# Patient Record
Sex: Female | Born: 1960 | Race: Black or African American | Hispanic: No | Marital: Married | Smoking: Never smoker
Health system: Southern US, Community
[De-identification: ages and names within clinical notes are randomized; demographics above are authoritative.]

## PROBLEM LIST (undated history)

## (undated) HISTORY — PX: BACK SURGERY: SHX140

## (undated) HISTORY — PX: GASTRIC BYPASS: SHX52

## (undated) HISTORY — PX: SPINAL FUSION: SHX223

## (undated) HISTORY — PX: ROTATOR CUFF REPAIR: SHX139

---

## 2015-07-12 DIAGNOSIS — R42 Dizziness and giddiness: Secondary | ICD-10-CM | POA: Insufficient documentation

## 2015-07-12 DIAGNOSIS — M5416 Radiculopathy, lumbar region: Secondary | ICD-10-CM | POA: Insufficient documentation

## 2015-07-12 DIAGNOSIS — M5412 Radiculopathy, cervical region: Secondary | ICD-10-CM | POA: Insufficient documentation

## 2015-12-05 DIAGNOSIS — Z9884 Bariatric surgery status: Secondary | ICD-10-CM | POA: Insufficient documentation

## 2016-03-21 ENCOUNTER — Emergency Department (HOSPITAL_COMMUNITY)
Admission: EM | Admit: 2016-03-21 | Discharge: 2016-03-21 | Disposition: A | Discharge status: Home / Self Care | Payer: Medicare Other | Attending: Emergency Medicine | Admitting: Emergency Medicine

## 2016-03-21 ENCOUNTER — Encounter (HOSPITAL_COMMUNITY): Payer: Self-pay | Admitting: Emergency Medicine

## 2016-03-21 DIAGNOSIS — Y999 Unspecified external cause status: Secondary | ICD-10-CM | POA: Diagnosis not present

## 2016-03-21 DIAGNOSIS — W260XXA Contact with knife, initial encounter: Secondary | ICD-10-CM | POA: Insufficient documentation

## 2016-03-21 DIAGNOSIS — Y929 Unspecified place or not applicable: Secondary | ICD-10-CM | POA: Insufficient documentation

## 2016-03-21 DIAGNOSIS — S61011A Laceration without foreign body of right thumb without damage to nail, initial encounter: Secondary | ICD-10-CM

## 2016-03-21 DIAGNOSIS — Y9389 Activity, other specified: Secondary | ICD-10-CM | POA: Insufficient documentation

## 2016-03-21 MED ORDER — HYDROCODONE-ACETAMINOPHEN 5-325 MG PO TABS: 1.0000 | ORAL_TABLET | Freq: Four times a day (QID) | ORAL | Status: DC | PRN

## 2016-03-21 MED ORDER — LIDOCAINE-EPINEPHRINE (PF) 1 %-1:200000 IJ SOLN
INTRAMUSCULAR | Status: AC
  Administered 2016-03-21: 17:00:00
  Filled 2016-03-21: qty 30

## 2016-03-21 MED ORDER — LIDOCAINE-EPINEPHRINE (PF) 1 %-1:200000 IJ SOLN
10.0000 mL | Freq: Once | INTRAMUSCULAR | Status: DC
  Filled 2016-03-21: qty 30

## 2016-03-21 MED ORDER — NAPROXEN 500 MG PO TABS: 500.0000 mg | ORAL_TABLET | Freq: Two times a day (BID) | ORAL | Status: DC | PRN

## 2016-03-21 MED ORDER — TETANUS-DIPHTH-ACELL PERTUSSIS 5-2.5-18.5 LF-MCG/0.5 IM SUSP
0.5000 mL | Freq: Once | INTRAMUSCULAR | Status: AC
  Administered 2016-03-21: 0.5 mL via INTRAMUSCULAR
  Filled 2016-03-21: qty 0.5

## 2016-03-21 NOTE — Discharge Instructions (Signed)
Keep wound clean with mild soap and water. Keep area covered with a topical antibiotic ointment and bandage, keep bandage dry, and do not submerge in water for 24 hours. Use thumb splint until the wound has healed in 7-10 days. You may remove the splint to take showers but do NOT bend the thumb while the splint is off. Ice and elevate for additional pain relief and swelling. Alternate between naprosyn and norco as directed as needed for additional pain relief, but don't drive while taking norco. Follow up with your primary care doctor or the [facility] in approximately 7-10 days for wound recheck and suture removal. Monitor area for signs of infection to include, but not limited to: increasing pain, spreading redness, drainage/pus, worsening swelling, or fevers. Return to emergency department for emergent changing or worsening symptoms.    Laceration Care, Adult A laceration is a cut that goes through all layers of the skin. The cut also goes into the tissue that is right under the skin. Some cuts heal on their own. Others need to be closed with stitches (sutures), staples, skin adhesive strips, or wound glue. Taking care of your cut lowers your risk of infection and helps your cut to heal better. HOW TO TAKE CARE OF YOUR CUT For stitches or staples:  Keep the wound clean and dry.  If you were given a bandage (dressing), you should change it at least one time per day or as told by your doctor. You should also change it if it gets wet or dirty.  Keep the wound completely dry for the first 24 hours or as told by your doctor. After that time, you may take a shower or a bath. However, make sure that the wound is not soaked in water until after the stitches or staples have been removed.  Clean the wound one time each day or as told by your doctor:  Wash the wound with soap and water.  Rinse the wound with water until all of the soap comes off.  Pat the wound dry with a clean towel.  Do not rub the wound.  After you clean the wound, put a thin layer of antibiotic ointment on it as told by your doctor. This ointment:  Helps to prevent infection.  Keeps the bandage from sticking to the wound.  Have your stitches or staples removed as told by your doctor. If your doctor used skin adhesive strips:   Keep the wound clean and dry.  If you were given a bandage, you should change it at least one time per day or as told by your doctor. You should also change it if it gets dirty or wet.  Do not get the skin adhesive strips wet. You can take a shower or a bath, but be careful to keep the wound dry.  If the wound gets wet, pat it dry with a clean towel. Do not rub the wound.  Skin adhesive strips fall off on their own. You can trim the strips as the wound heals. Do not remove any strips that are still stuck to the wound. They will fall off after a while. If your doctor used wound glue:  Try to keep your wound dry, but you may briefly wet it in the shower or bath. Do not soak the wound in water, such as by swimming.  After you take a shower or a bath, gently pat the wound dry with a clean towel. Do not rub the wound.  Do not do any activities that will make you really sweaty until the skin glue has fallen off on its own.  Do not apply liquid, cream, or ointment medicine to your wound while the skin glue is still on.  If you were given a bandage, you should change it at least one time per day or as told by your doctor. You should also change it if it gets dirty or wet.  If a bandage is placed over the wound, do not let the tape for the bandage touch the skin glue.  Do not pick at the glue. The skin glue usually stays on for 5-10 days. Then, it falls off of the skin. General Instructions  To help prevent scarring, make sure to cover your wound with sunscreen whenever you are outside after stitches are removed, after adhesive strips are removed, or when wound glue stays in  place and the wound is healed. Make sure to wear a sunscreen of at least 30 SPF.  Take over-the-counter and prescription medicines only as told by your doctor.  If you were given antibiotic medicine or ointment, take or apply it as told by your doctor. Do not stop using the antibiotic even if your wound is getting better.  Do not scratch or pick at the wound.  Keep all follow-up visits as told by your doctor. This is important.  Check your wound every day for signs of infection. Watch for:  Redness, swelling, or pain.  Fluid, blood, or pus.  Raise (elevate) the injured area above the level of your heart while you are sitting or lying down, if possible. GET HELP IF:  You got a tetanus shot and you have any of these problems at the injection site:  Swelling.  Very bad pain.  Redness.  Bleeding.  You have a fever.  A wound that was closed breaks open.  You notice a bad smell coming from your wound or your bandage.  You notice something coming out of the wound, such as wood or glass.  Medicine does not help your pain.  You have more redness, swelling, or pain at the site of your wound.  You have fluid, blood, or pus coming from your wound.  You notice a change in the color of your skin near your wound.  You need to change the bandage often because fluid, blood, or pus is coming from the wound.  You start to have a new rash.  You start to have numbness around the wound. GET HELP RIGHT AWAY IF:  You have very bad swelling around the wound.  Your pain suddenly gets worse and is very bad.  You notice painful lumps near the wound or on skin that is anywhere on your body.  You have a red streak going away from your wound.  The wound is on your hand or foot and you cannot move a finger or toe like you usually can.  The wound is on your hand or foot and you notice that your fingers or toes look pale or bluish.   This information is not intended to replace advice  given to you by your health care provider. Make sure you discuss any questions you have with your health care provider.   Document Released: 03/17/2008 Document Revised: 02/13/2015 Document Reviewed: 09/25/2014 Elsevier Interactive Patient Education 2016 [employer].  Stitches, [place], or Adhesive Wound Closure Health care providers use stitches (sutures), staples, and certain glue (skin adhesives) to hold skin together while it heals (wound  closure). You may need this treatment after you have surgery or if you cut your skin accidentally. These methods help your skin to heal more quickly and make it less likely that you will have a scar. A wound may take several months to heal completely. The type of wound you have determines when your wound gets closed. In most cases, the wound is closed as soon as possible (primary skin closure). Sometimes, closure is delayed so the wound can be cleaned and allowed to heal naturally. This reduces the chance of infection. Delayed closure may be needed if your wound:  Is caused by a bite.  Happened more than 6 hours ago.  Involves loss of skin or the tissues under the skin.  Has dirt or debris in it that cannot be removed.  Is infected. WHAT ARE THE DIFFERENT KINDS OF WOUND CLOSURES? There are many options for wound closure. The one that your health care provider uses depends on how deep and how large your wound is. Adhesive Glue To use this type of glue to close a wound, your health care provider holds the edges of the wound together and paints the glue on the surface of your skin. You may need more than one layer of glue. Then the wound may be covered with a light bandage (dressing). This type of skin closure may be used for small wounds that are not deep (superficial). Using glue for wound closure is less painful than other methods. It does not require a medicine that numbs the area (local anesthetic). This method also leaves nothing to be removed.  Adhesive glue is often used for children and on facial wounds. Adhesive glue cannot be used for wounds that are deep, uneven, or bleeding. It is not used inside of a wound.  Adhesive Strips These strips are made of sticky (adhesive), porous paper. They are applied across your skin edges like a regular adhesive bandage. You leave them on until they fall off. Adhesive strips may be used to close very superficial wounds. They may also be used along with sutures to improve the closure of your skin edges.  Sutures Sutures are the oldest method of wound closure. Sutures can be made from natural substances, such as silk, or from synthetic materials, such as nylon and steel. They can be made from a material that your body can break down as your wound heals (absorbable), or they can be made from a material that needs to be removed from your skin (nonabsorbable). They come in many different strengths and sizes. Your health care provider attaches the sutures to a steel needle on one end. Sutures can be passed through your skin, or through the tissues beneath your skin. Then they are tied and cut. Your skin edges may be closed in one continuous stitch or in separate stitches. Sutures are strong and can be used for all kinds of wounds. Absorbable sutures may be used to close tissues under the skin. The disadvantage of sutures is that they may cause skin reactions that lead to infection. Nonabsorbable sutures need to be removed. Staples When surgical staples are used to close a wound, the edges of your skin on both sides of the wound are brought close together. A staple is placed across the wound, and an instrument secures the edges together. Staples are often used to close surgical cuts (incisions). Staples are faster to use than sutures, and they cause less skin reaction. Staples need to be removed using a tool that bends the staples  away from your skin. HOW DO I CARE FOR MY WOUND CLOSURE?  Take medicines only as  directed by your health care provider.  If you were prescribed an antibiotic medicine for your wound, finish it all even if you start to feel better.  Use ointments or creams only as directed by your health care provider.  Wash your hands with soap and water before and after touching your wound.  Do not soak your wound in water. Do not take baths, swim, or use a hot tub until your health care provider approves.  Ask your health care provider when you can start showering. Cover your wound if directed by your health care provider.  Do not take out your own sutures or staples.  Do not pick at your wound. Picking can cause an infection.  Keep all follow-up visits as directed by your health care provider. This is important. HOW LONG WILL I HAVE MY WOUND CLOSURE?  Leave adhesive glue on your skin until the glue peels away.  Leave adhesive strips on your skin until the strips fall off.  Absorbable sutures will dissolve within several days.  Nonabsorbable sutures and staples must be removed. The location of the wound will determine how long they stay in. This can range from several days to a couple of weeks. WHEN SHOULD I SEEK HELP FOR MY WOUND CLOSURE? Contact your health care provider if:  You have a fever.  You have chills.  You have drainage, redness, swelling, or pain at your wound.  There is a bad smell coming from your wound.  The skin edges of your wound start to separate after your sutures have been removed.  Your wound becomes thick, raised, and darker in color after your sutures come out (scarring).   This information is not intended to replace advice given to you by your health care provider. Make sure you discuss any questions you have with your health care provider.   Document Released: 06/24/2001 Document Revised: 10/20/2014 Document Reviewed: 03/08/2014 Elsevier Interactive Patient Education 2016 [employer]  Sutured Wound Care Sutures are stitches that can  be used to close wounds. Taking care of your wound properly can help prevent pain and infection. It can also help your wound to heal more quickly. HOW TO CARE FOR YOUR SUTURED WOUND Wound Care  Keep the wound clean and dry.  If you were given a bandage (dressing), change it at least one time per day or as told by your doctor. You should also change it if it gets wet or dirty.  Keep the wound completely dry for the first 24 hours or as told by your doctor. After that time, you may shower or bathe. However, make sure that the wound is not soaked in water until the sutures have been removed.  Clean the wound one time each day or as told by your doctor.  Wash the wound with soap and water.  Rinse the wound with water to remove all soap.  Pat the wound dry with a clean towel. Do not rub the wound.  After cleaning the wound, put a thin layer of antibiotic ointment on it as told your doctor. This ointment:  Helps to prevent infection.  Keeps the bandage from sticking to the wound.  Have the sutures removed as told by your doctor. General Instructions  Take or apply medicines only as told by your doctor.  To help prevent scarring, make sure to cover your wound with sunscreen whenever you are outside after  the sutures are removed and the wound is healed. Make sure to wear a sunscreen of at least 30 SPF.  If you were prescribed an antibiotic medicine or ointment, finish all of it even if you start to feel better.  Do not scratch or pick at the wound.  Keep all follow-up visits as told by your doctor. This is important.  Check your wound every day for signs of infection. Watch for:  Redness, swelling, or pain.  Fluid, blood, or pus.  Raise (elevate) the injured area above the level of your heart while you are sitting or lying down, if possible.  Avoid stretching your wound.  Drink enough fluids to keep your pee (urine) clear or pale yellow. GET HELP IF:  You were given a  tetanus shot and you have any of these where the needle went in:  Swelling.  Very bad pain.  Redness.  Bleeding.  You have a fever.  A wound that was closed breaks open.  You notice a bad smell coming from the wound.  You notice something coming out of the wound, such as wood or glass.  Medicine does not help your pain.  You have any of these at the site of the wound.  More redness.  More swelling.  More pain.  You have any of these coming from the wound.  Fluid.  Blood.  Pus.  You notice a change in the color of your skin near the wound.  You need to change the bandage often due to fluid, blood, or pus coming from the wound.  You have a new rash.  You have numbness around the wound. GET HELP RIGHT AWAY IF:  You have very bad swelling around the wound.  Your pain suddenly gets worse and is very bad.  You have painful lumps near the wound or on skin that is anywhere on your body.  You have a red streak going away from the wound.  The wound is on your hand or foot and you cannot move a finger or toe like normal.  The wound is on your hand or foot and you notice that your fingers or toes look pale or bluish.   This information is not intended to replace advice given to you by your health care provider. Make sure you discuss any questions you have with your health care provider.   Document Released: 03/17/2008 Document Revised: 02/13/2015 Document Reviewed: 05/11/2013 Elsevier Interactive Patient Education [employer]

## 2016-03-21 NOTE — ED Provider Notes (Signed)
CSN: [id number]     Arrival date & time 03/21/16  1400 History  By signing my name below, I, [facility], attest that this documentation has been prepared under the direction and in the presence of Nesbit Michon Camprubi-Soms, PA-C. Electronically Signed: Randell Patient, ED Scribe. 03/21/2016. 4:43 PM.   Chief Complaint  Patient presents with  . Extremity Laceration    Patient is a 55 y.o. female presenting with skin laceration. The history is provided by the patient. No language interpreter was used.  Laceration Location:  Finger Finger laceration location:  R thumb Length (cm):  1 Depth:  Through dermis Quality: straight   Bleeding: controlled   Time since incident:  3 hours Laceration mechanism:  Knife Pain details:    Quality:  Dull and throbbing   Severity:  Moderate   Timing:  Constant   Progression:  Waxing and waning Foreign body present:  No foreign bodies Relieved by:  Nothing Worsened by:  Nothing tried Ineffective treatments:  Pressure and certain positions Tetanus status:  Unknown  HPI Comments: Merri Ray is a 55 y.o. female with no pertinent chronic conditions, who presents to the Emergency Department complaining of constant, 5/10 currently, 9/10 at worst, dull, throbbing right thumb pain at the site of a laceration, pain radiates to the right second finger, palm of hand, and into the right wrist, worse with movement of the thumb. The laceration occurred 3 hours ago. Pt states that she was sharpening a clean knife when she missed and the knife lacerated the knuckle of her right thumb, followed immediately by pain and bleeding, which has been controlled since the incident. She also reports gradual tingling in her right thumb that feels asleep/numb. She did not try anything for pain PTA. She is unsure of the date of her last tetanus shot. Denies hx of bleeding/clotting disorders and blood thinner use. Denies fevers, chills, CP, SOB, abd pain, n/v/, arthralgias, or  weakness.  History reviewed. No pertinent past medical history. Past Surgical History  Procedure Laterality Date  . Back surgery     No family history on file. Social History  Substance Use Topics  . Smoking status: None  . Smokeless tobacco: None  . Alcohol Use: None   OB History    No data available     Review of Systems  Constitutional: Negative for fever and chills.  Respiratory: Negative for shortness of breath.   Cardiovascular: Negative for chest pain.  Gastrointestinal: Negative for nausea, vomiting and abdominal pain.  Musculoskeletal: Positive for myalgias (at wound). Negative for arthralgias.  Skin: Positive for wound.  Allergic/Immunologic: Negative for immunocompromised state.  Neurological: Positive for numbness (R thumb paresthesias). Negative for weakness.  Hematological: Does not bruise/bleed easily.  Psychiatric/Behavioral: Negative for confusion.  10 Systems reviewed and are negative for acute change except as noted in the HPI.   Allergies  Penicillins and Tape  Home Medications   Prior to Admission medications   Not on File   BP 132/88 mmHg  Pulse 69  Temp(Src) 98.5 F (36.9 C) (Oral)  Resp 16  SpO2 99% Physical Exam  Constitutional: She is oriented to person, place, and time. Vital signs are normal. She appears well-developed and well-nourished.  Non-toxic appearance. No distress.  Afebrile, nontoxic, NAD  HENT:  Head: Normocephalic and atraumatic.  Mouth/Throat: Mucous membranes are normal.  Eyes: Conjunctivae and EOM are normal. Right eye exhibits no discharge. Left eye exhibits no discharge.  Neck: Normal range of motion. Neck supple.  Cardiovascular: Normal  rate and intact distal pulses.   Pulmonary/Chest: Effort normal. No respiratory distress.  Abdominal: Normal appearance. She exhibits no distension.  Musculoskeletal: Normal range of motion.       Right hand: She exhibits tenderness and laceration. She exhibits normal range of  motion, no bony tenderness, normal capillary refill, no deformity and no swelling. Decreased sensation (of the thumb) noted. Normal strength noted.       Hands: Right thumb with ~1 cm linerar laceration over the first MCP joint, with no exposed tendons or musculature, no visualized FBs, no ongoing bleeding, FROM intact in all digits, with minimal TTP over the laceration but no focal bony TTP in the remainder of the hand, strength grossly intact, cap refill brisk and present, but sensation slightly diminished distally to the wound only on the dorsal aspect of the thumb, sensation preserved in all other aspects of the thumb and hand.  Neurological: She is alert and oriented to person, place, and time. She has normal strength. No sensory deficit.  Skin: Skin is warm, dry and intact. No rash noted.  Psychiatric: She has a normal mood and affect. Her behavior is normal.  Nursing note and vitals reviewed.   ED Course  .Marland KitchenLaceration Repair Date/Time: 03/21/2016 4:22 PM Performed by: Allen Derry Authorized by: Allen Derry Consent: Verbal consent obtained. Risks and benefits: risks, benefits and alternatives were discussed Consent given by: patient Patient understanding: patient states understanding of the procedure being performed Patient consent: the patient's understanding of the procedure matches consent given Patient identity confirmed: verbally with patient Time out: Immediately prior to procedure a "time out" was called to verify the correct patient, procedure, equipment, support staff and site/side marked as required. Body area: upper extremity Location details: right thumb Laceration length: 1 cm Foreign bodies: no foreign bodies Tendon involvement: none Nerve involvement: unclear, possible superficial nerve involvement but no obvious nerves exposed. Vascular damage: no Anesthesia: local infiltration Local anesthetic: lidocaine 1% with epinephrine Anesthetic total:  1 ml Patient sedated: no Preparation: Patient was prepped and draped in the usual sterile fashion. Irrigation solution: saline Irrigation method: syringe Amount of cleaning: extensive Debridement: none Degree of undermining: none Skin closure: 5-0 Prolene Number of sutures: 1 Technique: simple Approximation: close Approximation difficulty: simple Dressing: 4x4 sterile gauze, splint and antibiotic ointment Patient tolerance: Patient tolerated the procedure well with no immediate complications     DIAGNOSTIC STUDIES: Oxygen Saturation is 99% on RA, normal by my interpretation.    COORDINATION OF CARE: 3:43 PM Will update tetanus. Will repair right thumb laceration. Discussed risks and benefits of ordering x-ray imaging of the right hand to rule-out fractures and foreign bodies and pt declined this imaging today, stating that she understands the risks of not getting the x-rays taken at this time. Discussed treatment plan with pt at bedside and pt agreed to plan.  4:28 PM Returned to perform laceration repair.   MDM   Final diagnoses:  Thumb laceration, right, initial encounter    55 y.o. female here with R thumb lac PTA, unsure of last tetanus therefore will update today. Vascularly intact, but some paresthesias distal to the wound, could indicate superficial nerve involvement. Discussed that this may or may not return as the wound heals, pt still able to feel pressure just feels "different". No obvious exposed nerves, no tendon/ligament/muscle involvement. Discussed risks/benefits of xray imaging at length, and pt adamantly declined stating she "knows nothing is broken or in the wound". Discussed that this could be risky if she  has an underlying infx, but she continues to decline xray and understands the risks, wants to proceed with wound closure. Will close wound with suture, and apply splint. Pt declined pain meds.  4:49 PM Wound closed with 1 suture. Wound irrigated well prior to  closure, no FBs noted. Tetanus updated. RICE discussed, splint placed and discussed proper use of this. F/up with PCP in 7-10 days for recheck and suture removal. Doubt need for prophylactic abx. Pain meds given. I explained the diagnosis and have given explicit precautions to return to the ER including for any other new or worsening symptoms. The patient understands and accepts the medical plan as it's been dictated and I have answered their questions. Discharge instructions concerning home care and prescriptions have been given. The patient is STABLE and is discharged to home in good condition.   I personally performed the services described in this documentation, which was scribed in my presence. The recorded information has been reviewed and is accurate.  BP 132/88 mmHg  Pulse 69  Temp(Src) 98.5 F (36.9 C) (Oral)  Resp 16  SpO2 99%  Meds ordered this encounter  Medications  . lidocaine-EPINEPHrine (XYLOCAINE-EPINEPHrine) 1 %-1:200000 (PF) injection 10 mL    Sig:   . Tdap (BOOSTRIX) injection 0.5 mL    Sig:   . lidocaine-EPINEPHrine (XYLOCAINE-EPINEPHrine) 1 %-1:200000 (PF) injection    Sig:     Consuello MasseAllen, Lauren   : cabinet override  . naproxen (NAPROSYN) 500 MG tablet    Sig: Take 1 tablet (500 mg total) by mouth 2 (two) times daily as needed for mild pain, moderate pain or headache (TAKE WITH MEALS.).    Dispense:  20 tablet    Refill:  0    Order Specific Question:  Supervising Provider    Answer:  MILLER, BRIAN [[id number]]  . HYDROcodone-acetaminophen (NORCO) 5-325 MG tablet    Sig: Take 1 tablet by mouth every 6 (six) hours as needed for severe pain.    Dispense:  6 tablet    Refill:  0    Order Specific Question:  Supervising Provider    Answer:  Eber HongMILLER, BRIAN [[id number]]      Andrena Margerum Camprubi-Soms, PA-C 03/21/16 1652  Bethann BerkshireJoseph Zammit, MD 03/21/16 2146

## 2016-03-21 NOTE — ED Notes (Signed)
Pt c/o pain/tingling pain in right thumb. Pt cut her right thumb approx an hour ago.

## 2016-08-01 ENCOUNTER — Encounter (INDEPENDENT_AMBULATORY_CARE_PROVIDER_SITE_OTHER): Payer: Self-pay | Admitting: Vascular Surgery

## 2016-08-01 ENCOUNTER — Ambulatory Visit (INDEPENDENT_AMBULATORY_CARE_PROVIDER_SITE_OTHER): Payer: Medicare Other | Admitting: Vascular Surgery

## 2016-08-01 ENCOUNTER — Encounter (INDEPENDENT_AMBULATORY_CARE_PROVIDER_SITE_OTHER): Payer: Self-pay

## 2016-08-01 VITALS — BP 132/84 | HR 68 | Resp 17 | Ht 66.0 in | Wt 177.0 lb

## 2016-08-01 DIAGNOSIS — I83812 Varicose veins of left lower extremities with pain: Secondary | ICD-10-CM | POA: Diagnosis not present

## 2016-08-01 DIAGNOSIS — I83892 Varicose veins of left lower extremities with other complications: Secondary | ICD-10-CM

## 2016-08-01 NOTE — Patient Instructions (Signed)
Bleeding Varicose Veins Varicose veins are veins that have become enlarged and twisted. Valves in the veins help return blood from the leg to the heart. If these valves are damaged, blood flows backward and backs up into the veins in the leg near the skin. This causes the veins to become larger because of increased pressure within them. Sometimes these veins bleed. CAUSES  Factors that can lead to bleeding varicose veins include:  Thinning of the skin that covers the veins. This skin is stretched as the veins enlarge.  Weak and thinning walls of the varicose veins. These thin walls are part of the reason why blood is not flowing normally to the heart.  Having high pressure in the veins. This high pressure occurs because the blood is not flowing freely back up to the heart.  Injury. Even a small injury to a varicose vein can cause bleeding.  Open wounds. A sore may develop near a varicose vein and not heal. This makes bleeding more likely.  Taking medicine that thins the blood. These medicines may include aspirin, anti-inflammatory medicine, and other blood thinners. SIGNS AND SYMPTOMS  If bleeding is on the outside surface of the skin, blood can be seen. Sometimes, the bleeding stays under the skin. If this happens, the blue or purple area will spread beyond the vein. This discoloration may be visible. DIAGNOSIS  To decide if you have a bleeding varicose vein, your health care provider may:  Ask about your symptoms. This will include when you first saw bleeding.  Ask about how long you have had varicose veins and if they cause you problems.  Ask about your overall health.  Ask about possible causes, such as recent cuts or if the area near the varicose veins was bumped or injured.  Examine the skin or leg that concerns you. Your health care provider will probably feel the veins.  Order imaging tests. These create detailed pictures of the veins. TREATMENT  The first goal of treating  bleeding varicose veins is to stop the bleeding. Then, the aim is to keep any bleeding from happening again. Treatment will depend on the cause of the bleeding and how bad it is. Ask your health care provider about what would be best for you. Options include:  Raising (elevating) your leg. Lie down with your leg propped up on a pillow or cushion. Your foot should be above the level of your heart.  Applying pressure to the spot that is bleeding. The bleeding should stop in a short time.  Wearing elastic stockings that "compress" your legs (compression stockings). An elastic bandage may do the same thing.  Applying an antibiotic cream on sores that are not healing.  Closing off or surgically removing the bleeding varicose veins with one of the following:  Sclerotherapy. A solution is injected into the vein to close it off.  Laser treatment. A laser is used to heat the vein to close it off.  Radiofrequency vein ablation. An electrical current produced by radio waves is used to close off the vein.  Phlebectomy. The vein is surgically removed through small incisions made over the varicose vein.  Vein ligation and stripping. The vein is surgically removed through incisions made over the varicose vein after the vein has been tied (ligated). HOME CARE INSTRUCTIONS   Apply any creams that your health care provider prescribed. Follow the directions carefully.  Wear compression stockings or any wraps as directed by your health care provider. Make sure you know:  If   you should wear them every day.  How long you should wear them.  If veins were removed or closed, a bandage (dressing) will probably cover the area. Make sure you know:  How often the dressing should be changed.  Whether the area can get wet.  When you can leave the skin uncovered.  Check your skin every day. Look for new sores and signs of bleeding.  To prevent future bleeding:  Use extra care in situations where you could  cut your legs, such as when shaving or gardening.  Try to keep your legs elevated as much as possible. Lie down when you can. SEEK MEDICAL CARE IF:   Your veins continue to bleed.  You develop new sores near your varicose veins.  You have a sore that does not heal or gets bigger.  You have increased pain in your leg.  The area around a varicose vein becomes warm, red, or tender to the touch.  You notice a yellowish fluid that smells bad coming from a spot where there was bleeding.  You have a fever. SEEK IMMEDIATE MEDICAL CARE IF:   You have chest pain or difficulty breathing.  You have severe leg pain.   This information is not intended to replace advice given to you by your health care provider. Make sure you discuss any questions you have with your health care provider.   Document Released: 02/15/2009 Document Revised: 10/20/2014 Document Reviewed: 01/31/2014 Elsevier Interactive Patient Education 2016 [employer]  

## 2016-08-01 NOTE — Progress Notes (Signed)
Patient ID: Jill Kerr, female   DOB: [birth year], 55 y.o.   MRN: [id number]  Chief Complaint  Patient presents with  . New Patient (Initial Visit)    HPI Jill Kerr is a 55 y.o. female.   The patient presents with complaints of symptomatic varicosities of the Lower extremity. The patient reports a long standing history of varicosities and they have become painful over time. She presents this time with a large hemorrhage on her left medial leg and calf area secondary to bleeding from minimal trauma to varicosities and the site.  The left leg is more severly affected. The patient elevates the legs for relief. The pain is described as aching and tenderness in the skin. The symptoms are generally most severe in the evening, particularly when they have been on their feet for long periods of time. Elevation and heat has been used to try to improve the symptoms with minimal success. The patient complains of some left leg swelling as an associated symptom. She tried compression stockings but the bruising in the area would not allow it because it was too tender just below the knee. The patient has no previous history of deep venous thrombosis or superficial thrombophlebitis to their knowledge.     No past medical history on file.  Past Surgical History:  Procedure Laterality Date  . BACK SURGERY    . BACK SURGERY    . ROTATOR CUFF REPAIR Right   . SPINAL FUSION      Family History  Problem Relation Age of Onset  . Cancer Mother   . Hypertension Mother      Social History Social History  Substance Use Topics  . Smoking status: Never Smoker  . Smokeless tobacco: Never Used  . Alcohol use Yes     Allergies  Allergen Reactions  . Penicillins   . Tape     Adhesive     Current Outpatient Prescriptions  Medication Sig Dispense Refill  . HYDROcodone-acetaminophen (NORCO) 5-325 MG tablet Take 1 tablet by mouth every 6 (six) hours as needed for severe pain. 6 tablet 0  .  naproxen (NAPROSYN) 500 MG tablet Take 1 tablet (500 mg total) by mouth 2 (two) times daily as needed for mild pain, moderate pain or headache (TAKE WITH MEALS.). 20 tablet 0  . traMADol (ULTRAM) 50 MG tablet Take by mouth every 6 (six) hours as needed.     No current facility-administered medications for this visit.       REVIEW OF SYSTEMS (Negative unless checked)  Constitutional: [] Weight loss  [] Fever  [] Chills Cardiac: [] Chest pain   [] Chest pressure   [] Palpitations   [] Shortness of breath when laying flat   [] Shortness of breath at rest   [] Shortness of breath with exertion. Vascular:  [] Pain in legs with walking   [x] Pain in legs at rest   [] Pain in legs when laying flat   [] Claudication   [] Pain in feet when walking  [] Pain in feet at rest  [] Pain in feet when laying flat   [] History of DVT   [] Phlebitis   [x] Swelling in legs   [x] Varicose veins   [] Non-healing ulcers Pulmonary:   [] Uses home oxygen   [] Productive cough   [] Hemoptysis   [] Wheeze  [] COPD   [] Asthma Neurologic:  [] Dizziness  [] Blackouts   [] Seizures   [] History of stroke   [] History of TIA  [] Aphasia   [] Temporary blindness   [] Dysphagia   [] Weakness or numbness in arms   [] Weakness or  numbness in legs Musculoskeletal:  [] Arthritis   [] Joint swelling   [] Joint pain   [] Low back pain Hematologic:  [] Easy bruising  [] Easy bleeding   [] Hypercoagulable state   [] Anemic  [] Hepatitis Gastrointestinal:  [] Blood in stool   [] Vomiting blood  [] Gastroesophageal reflux/heartburn   [] Abdominal pain Genitourinary:  [] Chronic kidney disease   [] Difficult urination  [] Frequent urination  [] Burning with urination   [] Hematuria Skin:  [] Rashes   [] Ulcers   [] Wounds Psychological:  [] History of anxiety   []  History of major depression.    Physical Exam BP 132/84   Pulse 68   Resp 17   Ht 5\' 6"  (1.676 m)   Wt 177 lb (80.3 kg)   BMI 28.57 kg/m  Gen:  WD/WN, NAD Head: [place]/AT, No temporalis wasting.  Ear/Nose/Throat: Hearing  grossly intact, Trachea midline Eyes: Sclera non-icteric. Conjunctiva clear Neck: Supple, no nuchal rigidity. Trachea midline Pulmonary:  Good air movement, no use of accessory muscles, respirations not labored.  Cardiac: RRR, No JVD Vascular: Varicosities scattered and measuring up to 3 mm in the right lower extremity        Varicosities diffuse and measuring up to 5 mm in the left lower extremity Vessel Right Left  Radial Palpable Palpable  Ulnar Palpable Palpable  Brachial Palpable Palpable  Carotid Palpable, without bruit Palpable, without bruit  Aorta Not palpable N/A  Femoral Palpable Palpable  Popliteal Palpable Palpable  PT Palpable Palpable  DP Palpable Palpable   Gastrointestinal: soft, non-tender/non-distended. No guarding/reflex. No masses, surgical incisions, or scars. Musculoskeletal: M/S 5/5 throughout.   No RLE edema.  1 + LLE edema with a moderate bruise and hematoma on the left medial calf and knee area. Neurologic: CN 2-12 intact. Pain and light touch intact in extremities.  Symmetrical.  Speech is fluent.  Psychiatric: Judgment intact, Mood & affect appropriate for pt's clinical situation. Dermatologic: No rashes or ulcers noted.  No cellulitis or open wounds. Lymph : No Cervical, Axillary, or Inguinal lymphadenopathy.   Radiology No results found.  Labs No results found for this or any previous visit (from the past 2160 hour(s)).  Assessment/Plan:  Varicose veins of leg with pain, left See treatment plan as below.  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 She has a large bruise residual now from a hemorrhage several week ago. I have recommended a venous evaluation for full assessment of her venous system of the left lower extremity. I will see her back following the study.    The patient has symptoms consistent  with chronic venous insufficiency. We discussed the natural history and treatment options for venous disease. I recommended the regular use of 20 - 30 mm Hg compression stockings, and prescribed these today.She will need thigh-high compression stockings due to the pain and bruising just below her knee area. I recommended leg elevation and anti-inflammatories as needed for pain. I have also recommended a complete venous duplex to assess the venous system for reflux or thrombotic issues. This can be done at the patient's convenience. I will see the patient backAfter they're duplex to assess the response to conservative management, and determine further treatment options.     Festus Barren 08/01/2016, 4:00 PM   This note was created with Dragon medical transcription system.  Any errors from dictation are unintentional.

## 2016-08-01 NOTE — Assessment & Plan Note (Signed)
The patient has had multiple episodes of spontaneous hemorrhage from minimal trauma due to varicosities in the left lower extremity. She has a large bruise residual now from a hemorrhage several week ago. I have recommended a venous evaluation for full assessment of her venous system of the left lower extremity. I will see her back following the study.

## 2016-08-01 NOTE — Assessment & Plan Note (Signed)
See treatment plan as below 

## 2016-08-25 ENCOUNTER — Encounter (INDEPENDENT_AMBULATORY_CARE_PROVIDER_SITE_OTHER): Payer: Medicare Other

## 2016-08-25 ENCOUNTER — Ambulatory Visit (INDEPENDENT_AMBULATORY_CARE_PROVIDER_SITE_OTHER): Payer: Medicare Other | Admitting: Vascular Surgery

## 2016-10-14 ENCOUNTER — Ambulatory Visit (HOSPITAL_COMMUNITY)
Admission: EM | Admit: 2016-10-14 | Discharge: 2016-10-14 | Disposition: A | Discharge status: Home / Self Care | Payer: Medicare Other | Attending: Family Medicine | Admitting: Family Medicine

## 2016-10-14 ENCOUNTER — Ambulatory Visit (INDEPENDENT_AMBULATORY_CARE_PROVIDER_SITE_OTHER): Payer: Medicare Other

## 2016-10-14 ENCOUNTER — Encounter (HOSPITAL_COMMUNITY): Payer: Self-pay | Admitting: Family Medicine

## 2016-10-14 DIAGNOSIS — S2231XA Fracture of one rib, right side, initial encounter for closed fracture: Secondary | ICD-10-CM | POA: Diagnosis not present

## 2016-10-14 MED ORDER — KETOROLAC TROMETHAMINE 60 MG/2ML IM SOLN
INTRAMUSCULAR | Status: AC
  Filled 2016-10-14: qty 2

## 2016-10-14 MED ORDER — DICLOFENAC SODIUM 75 MG PO TBEC: 75.0000 mg | DELAYED_RELEASE_TABLET | Freq: Two times a day (BID) | ORAL | 0 refills | Status: AC

## 2016-10-14 MED ORDER — HYDROCODONE-ACETAMINOPHEN 10-325 MG PO TABS: 1.0000 | ORAL_TABLET | Freq: Four times a day (QID) | ORAL | 0 refills | Status: AC | PRN

## 2016-10-14 MED ORDER — KETOROLAC TROMETHAMINE 60 MG/2ML IM SOLN
60.0000 mg | Freq: Once | INTRAMUSCULAR | Status: AC
  Administered 2016-10-14: 60 mg via INTRAMUSCULAR
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
## 2016-10-14 NOTE — Discharge Instructions (Signed)
You have a possible fracture of your 9th rib on the right side. You have been given an injection in clinic for pain and you have been prescribed two medications for pain to take at home. You have been given hydrocodone with acetaminophen, do not drink, drive, or operate heavy machinery while on this medicine. You have also been given a non-steroidal antiinflammatory called diclofenac. Should your symptoms fail to improve follow up with your primary care provider or return to clinic.

## 2017-07-30 ENCOUNTER — Encounter (INDEPENDENT_AMBULATORY_CARE_PROVIDER_SITE_OTHER): Payer: Self-pay | Admitting: Vascular Surgery

## 2017-07-30 ENCOUNTER — Ambulatory Visit (INDEPENDENT_AMBULATORY_CARE_PROVIDER_SITE_OTHER): Payer: Medicare Other | Admitting: Vascular Surgery

## 2017-07-30 ENCOUNTER — Ambulatory Visit (INDEPENDENT_AMBULATORY_CARE_PROVIDER_SITE_OTHER): Payer: Medicare Other

## 2017-07-30 VITALS — BP 119/80 | HR 68 | Resp 17 | Wt 175.0 lb

## 2017-07-30 DIAGNOSIS — I83892 Varicose veins of left lower extremities with other complications: Secondary | ICD-10-CM

## 2017-07-30 DIAGNOSIS — I83812 Varicose veins of left lower extremities with pain: Secondary | ICD-10-CM

## 2017-07-30 NOTE — Progress Notes (Signed)
Subjective:    Patient ID: Jill Kerr, female    DOB: 1961/01/10, 56 y.o.   MRN: [id number] Chief Complaint  Patient presents with  . Follow-up    pt conv lle ven reflux   Patient presents to review vascular studies. Patient was last seen in October 2017 with a chief complaint of spontaneous hemorrhage from minimal trauma due to varicosities in the left lower extremity. The patient states she has not had any recent episodes of spontaneous hemorrhage. The patient does experience left lower extremity edema.This edema is associated with discomfort. The patient notes the edema is worse towards the end of the day. The patient also notes there is pain along her varicosities to the left lower extremity. At this time, the patient is not engaging in conservative therapy. The patient's discomfort has progressed to the point she is unable to function on a daily basis and this is prompted her to seek medical attention. The patient underwent a left lower extremity venous reflux exam was notable for venous incompetence in the left common femoral vein. There is no evidence of deep or superficial vein thrombosis to the lower extremity. The patient denies any fever, nausea or vomiting.   Review of Systems  Constitutional: Negative.   HENT: Negative.   Eyes: Negative.   Respiratory: Negative.   Cardiovascular: Positive for leg swelling.       Left lower externally pain  Gastrointestinal: Negative.   Endocrine: Negative.   Genitourinary: Negative.   Musculoskeletal: Negative.   Skin: Negative.   Allergic/Immunologic: Negative.   Neurological: Negative.   Hematological: Negative.   Psychiatric/Behavioral: Negative.       Objective:   Physical Exam  Constitutional: She is oriented to person, place, and time. She appears well-developed and well-nourished. No distress.  HENT:  Head: Normocephalic and atraumatic.  Eyes: Pupils are equal, round, and reactive to light. Conjunctivae are normal.  Neck:  Normal range of motion.  Cardiovascular: Normal rate, regular rhythm, normal heart sounds and intact distal pulses.   Pulses:      Radial pulses are 2+ on the right side, and 2+ on the left side.       Dorsalis pedis pulses are 2+ on the right side, and 2+ on the left side.       Posterior tibial pulses are 2+ on the right side, and 2+ on the left side.  Pulmonary/Chest: Effort normal.  Musculoskeletal: Normal range of motion. She exhibits edema (mild left lower extremity edema).  Neurological: She is alert and oriented to person, place, and time.  Skin: Skin is warm and dry. She is not diaphoretic.  Less than 1cm scattered varicosities to the left lower extremity. There is no open skin or ulceration. No signs of hemorrhage. No stasis dermatitis.  Psychiatric: She has a normal mood and affect. Her behavior is normal. Judgment and thought content normal.  Vitals reviewed.  BP 119/80 (BP Location: Left Arm)   Pulse 68   Resp 17   Wt 175 lb (79.4 kg)   BMI 28.25 kg/m   No past medical history on file.  Social History   Social History  . Marital status: Single    Spouse name: N/A  . Number of children: N/A  . Years of education: N/A   Occupational History  . Not on file.   Social History Main Topics  . Smoking status: Never Smoker  . Smokeless tobacco: Never Used  . Alcohol use Yes  . Drug use: No  .  Sexual activity: Not on file   Other Topics Concern  . Not on file   Social History Narrative  . No narrative on file   Past Surgical History:  Procedure Laterality Date  . BACK SURGERY    . BACK SURGERY    . ROTATOR CUFF REPAIR Right   . SPINAL FUSION     Family History  Problem Relation Age of Onset  . Cancer Mother   . Hypertension Mother    Allergies  Allergen Reactions  . Penicillins   . Tape     Adhesive       Assessment & Plan:  Patient presents to review vascular studies. Patient was last seen in October 2017 with a chief complaint of spontaneous  hemorrhage from minimal trauma due to varicosities in the left lower extremity. The patient states she has not had any recent episodes of spontaneous hemorrhage. The patient does experience left lower extremity edema.This edema is associated with discomfort. The patient notes the edema is worse towards the end of the day. The patient also notes there is pain along her varicosities to the left lower extremity. At this time, the patient is not engaging in conservative therapy. The patient's discomfort has progressed to the point she is unable to function on a daily basis and this is prompted her to seek medical attention. The patient underwent a left lower extremity venous reflux exam was notable for venous incompetence in the left common femoral vein. There is no evidence of deep or superficial vein thrombosis to the lower extremity. The patient denies any fever, nausea or vomiting.  1. Varicose veins of leg with pain, left - Stable No recent hemorrhage to the left lower extremity. The patient was encouraged to wear graduated compression stockings (20-30 mmHg) on a daily basis. The patient was instructed to begin wearing the stockings first thing in the morning and removing them in the evening. The patient was instructed specifically not to sleep in the stockings. Prescription given In addition, behavioral modification including elevation during the day will be initiated. Anti-inflammatories for pain. Patient to follow up in 1 month if conservative therapy fails patient may be a candidate for lymphedema pump.  2. Hemorrhage of varicose veins of left lower extremity - Stable As above  Current Outpatient Prescriptions on File Prior to Visit  Medication Sig Dispense Refill  . HYDROcodone-acetaminophen (NORCO) 10-325 MG tablet Take 1 tablet by mouth every 6 (six) hours as needed. (Patient not taking: Reported on 07/30/2017) 10 tablet 0   No current facility-administered medications on file prior to visit.      There are no Patient Instructions on file for this visit. No Follow-up on file.   Lovelace Cerveny A Rhyse Skowron, PA-C

## 2017-08-17 ENCOUNTER — Encounter: Payer: Medicare Other | Admitting: Vascular Surgery

## 2017-08-17 ENCOUNTER — Encounter (HOSPITAL_COMMUNITY): Payer: Medicare Other

## 2017-09-01 ENCOUNTER — Ambulatory Visit (INDEPENDENT_AMBULATORY_CARE_PROVIDER_SITE_OTHER): Payer: Medicare Other | Admitting: Vascular Surgery

## 2017-12-03 ENCOUNTER — Other Ambulatory Visit (HOSPITAL_COMMUNITY)
Admission: RE | Admit: 2017-12-03 | Discharge: 2017-12-03 | Disposition: A | Discharge status: Home / Self Care | Payer: Medicare Other | Source: Ambulatory Visit | Attending: Obstetrics | Admitting: Obstetrics

## 2017-12-03 ENCOUNTER — Ambulatory Visit (INDEPENDENT_AMBULATORY_CARE_PROVIDER_SITE_OTHER): Payer: Medicare Other | Admitting: Obstetrics

## 2017-12-03 ENCOUNTER — Encounter: Payer: Self-pay | Admitting: Obstetrics

## 2017-12-03 ENCOUNTER — Other Ambulatory Visit: Payer: Self-pay

## 2017-12-03 VITALS — BP 126/81 | HR 71 | Ht 66.0 in | Wt 171.0 lb

## 2017-12-03 DIAGNOSIS — E2839 Other primary ovarian failure: Secondary | ICD-10-CM

## 2017-12-03 DIAGNOSIS — Z01419 Encounter for gynecological examination (general) (routine) without abnormal findings: Secondary | ICD-10-CM

## 2017-12-03 NOTE — Progress Notes (Signed)
New GYN/AEX/PAP 

## 2017-12-03 NOTE — Progress Notes (Signed)
Subjective:        Jill Kerr is a 57 y.o. female here for a routine exam.  Current complaints: None.    Personal health questionnaire:  Is patient Jill Kerr, have a family history of breast and/or ovarian cancer: no Is there a family history of uterine cancer diagnosed at age < 28, gastrointestinal cancer, urinary tract cancer, family member who is a Personnel officer syndrome-associated carrier: no Is the patient overweight and hypertensive, family history of diabetes, personal history of gestational diabetes, preeclampsia or PCOS: no Is patient over 84, have PCOS,  family history of premature CHD under age 33, diabetes, smoke, have hypertension or peripheral artery disease:  no At any time, has a partner hit, kicked or otherwise hurt or frightened you?: no Over the past 2 weeks, have you felt down, depressed or hopeless?: no Over the past 2 weeks, have you felt little interest or pleasure in doing things?:no   Gynecologic History No LMP recorded. Patient has had an ablation.  Amenorrhea after Ablation Contraception: none and has had endometrial ablation Last Pap: 2016. Results were: normal Last mammogram: 2018. Results were: normal  Obstetric History OB History  Gravida Para Term Preterm AB Living  5 3 3   2 3   SAB TAB Ectopic Multiple Live Births               # Outcome Date GA Lbr Len/2nd Weight Sex Delivery Anes PTL Lv  5 Term           4 Term           3 Term           2 AB           1 AB               History reviewed. No pertinent past medical history.  Past Surgical History:  Procedure Laterality Date  . BACK SURGERY    . BACK SURGERY    . BACK SURGERY    . GASTRIC BYPASS    . ROTATOR CUFF REPAIR Right   . SPINAL FUSION       Current Outpatient Medications:  .  cyclobenzaprine (FLEXERIL) 10 MG tablet, Take by mouth as needed. , Disp: , Rfl:  .  diclofenac (VOLTAREN) 75 MG EC tablet, Take by mouth as needed. , Disp: , Rfl:  .  gabapentin (NEURONTIN) 400  MG capsule, as needed, Disp: , Rfl:  .  HYDROcodone-acetaminophen (NORCO) 10-325 MG tablet, Take 1 tablet by mouth every 6 (six) hours as needed. (Patient not taking: Reported on 07/30/2017), Disp: 10 tablet, Rfl: 0 .  traMADol (ULTRAM) 50 MG tablet, Take by mouth every 6 (six) hours as needed. , Disp: , Rfl:  Allergies  Allergen Reactions  . Penicillins   . Tape     Adhesive     Social History   Tobacco Use  . Smoking status: Never Smoker  . Smokeless tobacco: Never Used  Substance Use Topics  . Alcohol use: Yes    Comment: ocassionally    Family History  Problem Relation Age of Onset  . Cancer Mother   . Hypertension Mother       Review of Systems  Constitutional: negative for fatigue and weight loss Respiratory: negative for cough and wheezing Cardiovascular: negative for chest pain, fatigue and palpitations Gastrointestinal: negative for abdominal pain and change in bowel habits Musculoskeletal:negative for myalgias Neurological: negative for gait problems and tremors Behavioral/Psych: negative for abusive relationship,  depression Endocrine: negative for temperature intolerance    Genitourinary:negative for abnormal menstrual periods, genital lesions, hot flashes, sexual problems and vaginal discharge Integument/breast: negative for breast lump, breast tenderness, nipple discharge and skin lesion(s)    Objective:       BP 126/81 (BP Location: Left Arm, Cuff Size: Normal)   Pulse 71   Ht 5\' 6"  (1.676 m)   Wt 171 lb (77.6 kg)   BMI 27.60 kg/m  General:   alert  Skin:   no rash or abnormalities  Lungs:   clear to auscultation bilaterally  Heart:   regular rate and rhythm, S1, S2 normal, no murmur, click, rub or gallop  Breasts:   normal without suspicious masses, skin or nipple changes or axillary nodes  Abdomen:  normal findings: no organomegaly, soft, non-tender and no hernia  Pelvis:  External genitalia: normal general appearance Urinary system: urethral  meatus normal and bladder without fullness, nontender Vaginal: normal without tenderness, induration or masses Cervix: normal appearance Adnexa: normal bimanual exam Uterus: anteverted and non-tender, normal size   Lab Review Urine pregnancy test Labs reviewed yes Radiologic studies reviewed yes  50% of 20 min visit spent on counseling and coordination of care.   Assessment:     1. Encounter for routine gynecological examination with Papanicolaou smear of cervix Rx: - Cytology - PAP  2. Decreased estrogen level Rx: - DG BONE DENSITY (DXA); Future   Plan:    Education reviewed: calcium supplements, depression evaluation, low fat, low cholesterol diet, safe sex/STD prevention, self breast exams and weight bearing exercise. Contraception: none. Follow up in: 1 year. Bone Density Study    Orders Placed This Encounter  Procedures  . DG BONE DENSITY (DXA)    Standing Status:   Future    Standing Expiration Date:   02/01/2019    Order Specific Question:   Reason for Exam (SYMPTOM  OR DIAGNOSIS REQUIRED)    Answer:   Screening    Order Specific Question:   Is the patient pregnant?    Answer:   No    Order Specific Question:   Preferred imaging location?    [facility]    Brock BadHARLES A. HARPER MD

## 2017-12-04 LAB — CERVICOVAGINAL ANCILLARY ONLY
Chlamydia: NEGATIVE
Neisseria Gonorrhea: NEGATIVE

## 2017-12-04 NOTE — Addendum Note (Signed)
Addended by: Maretta BeesMCGLASHAN, Emma-Lee Oddo J on: 12/04/2017 08:16 AM   Modules accepted: Orders

## 2017-12-08 LAB — CYTOLOGY - PAP
Diagnosis: NEGATIVE
HPV (WINDOPATH): NOT DETECTED

## 2017-12-30 ENCOUNTER — Other Ambulatory Visit: Payer: Self-pay | Admitting: Obstetrics

## 2017-12-30 DIAGNOSIS — E2839 Other primary ovarian failure: Secondary | ICD-10-CM

## 2018-01-20 ENCOUNTER — Inpatient Hospital Stay: Admission: RE | Admit: 2018-01-20 | Payer: Medicare Other | Source: Ambulatory Visit

## 2018-05-11 ENCOUNTER — Ambulatory Visit
Admission: RE | Admit: 2018-05-11 | Discharge: 2018-05-11 | Disposition: A | Discharge status: Home / Self Care | Payer: Medicare Other | Source: Ambulatory Visit | Attending: Obstetrics | Admitting: Obstetrics

## 2018-05-11 DIAGNOSIS — E2839 Other primary ovarian failure: Secondary | ICD-10-CM

## 2018-12-09 DIAGNOSIS — E538 Deficiency of other specified B group vitamins: Secondary | ICD-10-CM | POA: Insufficient documentation

## 2020-07-12 ENCOUNTER — Encounter: Payer: Self-pay | Admitting: Obstetrics

## 2020-07-12 ENCOUNTER — Ambulatory Visit (INDEPENDENT_AMBULATORY_CARE_PROVIDER_SITE_OTHER): Payer: Medicare Other | Admitting: Obstetrics

## 2020-07-12 ENCOUNTER — Other Ambulatory Visit: Payer: Self-pay

## 2020-07-12 ENCOUNTER — Other Ambulatory Visit (HOSPITAL_COMMUNITY)
Admission: RE | Admit: 2020-07-12 | Discharge: 2020-07-12 | Disposition: A | Discharge status: Home / Self Care | Payer: Medicare Other | Source: Ambulatory Visit | Attending: Obstetrics | Admitting: Obstetrics

## 2020-07-12 VITALS — BP 146/86 | HR 67 | Wt 187.0 lb

## 2020-07-12 DIAGNOSIS — Z1151 Encounter for screening for human papillomavirus (HPV): Secondary | ICD-10-CM | POA: Insufficient documentation

## 2020-07-12 DIAGNOSIS — Z01419 Encounter for gynecological examination (general) (routine) without abnormal findings: Secondary | ICD-10-CM | POA: Insufficient documentation

## 2020-07-12 DIAGNOSIS — Z780 Asymptomatic menopausal state: Secondary | ICD-10-CM

## 2020-07-12 DIAGNOSIS — E2839 Other primary ovarian failure: Secondary | ICD-10-CM | POA: Diagnosis not present

## 2020-07-12 DIAGNOSIS — Z124 Encounter for screening for malignant neoplasm of cervix: Secondary | ICD-10-CM | POA: Diagnosis not present

## 2020-07-12 DIAGNOSIS — M8580 Other specified disorders of bone density and structure, unspecified site: Secondary | ICD-10-CM

## 2020-07-12 DIAGNOSIS — E559 Vitamin D deficiency, unspecified: Secondary | ICD-10-CM | POA: Diagnosis not present

## 2020-07-12 NOTE — Progress Notes (Signed)
Subjective:        Jill Kerr is a 59 y.o. female here for a routine exam.  Current complaints: None.    Personal health questionnaire:  Is patient [name], have a family history of breast and/or ovarian cancer: no Is there a family history of uterine cancer diagnosed at age < 52, gastrointestinal cancer, urinary tract cancer, family member who is a Personnel officer syndrome-associated carrier: no Is the patient overweight and hypertensive, family history of diabetes, personal history of gestational diabetes, preeclampsia or PCOS: no Is patient over 11, have PCOS,  family history of premature CHD under age 69, diabetes, smoke, have hypertension or peripheral artery disease:  no At any time, has a partner hit, kicked or otherwise hurt or frightened you?: no Over the past 2 weeks, have you felt down, depressed or hopeless?: no Over the past 2 weeks, have you felt little interest or pleasure in doing things?:no   Gynecologic History No LMP recorded. Patient has had an ablation. Contraception: post menopausal status Last Pap: 3018. Results were: normal Last mammogram: 2021. Results were: normal  Obstetric History OB History  Gravida Para Term Preterm AB Living  5 3 3   2 3   SAB TAB Ectopic Multiple Live Births               # Outcome Date GA Lbr Len/2nd Weight Sex Delivery Anes PTL Lv  5 Term           4 Term           3 Term           2 AB           1 AB             History reviewed. No pertinent past medical history.  Past Surgical History:  Procedure Laterality Date  . BACK SURGERY    . BACK SURGERY    . BACK SURGERY    . GASTRIC BYPASS    . ROTATOR CUFF REPAIR Right   . SPINAL FUSION       Current Outpatient Medications:  .  cyclobenzaprine (FLEXERIL) 10 MG tablet, Take by mouth as needed. , Disp: , Rfl:  .  gabapentin (NEURONTIN) 400 MG capsule, as needed, Disp: , Rfl:  .  HYDROcodone-acetaminophen (NORCO) 10-325 MG tablet, Take 1 tablet by mouth every 6  (six) hours as needed. (Patient not taking: Reported on 07/30/2017), Disp: 10 tablet, Rfl: 0 Allergies  Allergen Reactions  . Penicillins   . Tape     Adhesive     Social History   Tobacco Use  . Smoking status: Never Smoker  . Smokeless tobacco: Never Used  Substance Use Topics  . Alcohol use: Yes    Comment: ocassionally    Family History  Problem Relation Age of Onset  . Cancer Mother   . Hypertension Mother       Review of Systems  Constitutional: negative for fatigue and weight loss Respiratory: negative for cough and wheezing Cardiovascular: negative for chest pain, fatigue and palpitations Gastrointestinal: negative for abdominal pain and change in bowel habits Musculoskeletal:negative for myalgias Neurological: negative for gait problems and tremors Behavioral/Psych: negative for abusive relationship, depression Endocrine: negative for temperature intolerance    Genitourinary:negative for abnormal menstrual periods, genital lesions, hot flashes, sexual problems and vaginal discharge Integument/breast: negative for breast lump, breast tenderness, nipple discharge and skin lesion(s)    Objective:       BP 08/01/2017)  146/86   Pulse 67   Wt 187 lb (84.8 kg)   BMI 30.18 kg/m  General:   alert  Skin:   no rash or abnormalities  Lungs:   clear to auscultation bilaterally  Heart:   regular rate and rhythm, S1, S2 normal, no murmur, click, rub or gallop  Breasts:   normal without suspicious masses, skin or nipple changes or axillary nodes  Abdomen:  normal findings: no organomegaly, soft, non-tender and no hernia  Pelvis:  External genitalia: normal general appearance Urinary system: urethral meatus normal and bladder without fullness, nontender Vaginal: normal without tenderness, induration or masses Cervix: normal appearance Adnexa: normal bimanual exam Uterus: anteverted and non-tender, normal size   Lab Review Urine pregnancy test Labs reviewed yes Radiologic  studies reviewed yes  50% of 20 min visit spent on counseling and coordination of care.   Assessment:     1. Encounter for routine gynecological examination with Papanicolaou smear of cervix Rx: - Cytology - PAP( [place])  2. Osteopenia after menopause - taking Ca++ and Vitamin D - followed by PCP  3. Vitamin D deficiency  4. Hypoestrogenism Rx: - DG BONE DENSITY (DXA); Future    Plan:    Education reviewed: calcium supplements, depression evaluation, low fat, low cholesterol diet, safe sex/STD prevention, self breast exams and weight bearing exercise. Follow up in: 2 years. Bone Density Study ordered    Orders Placed This Encounter  Procedures  . DG BONE DENSITY (DXA)    mcr a & b Pf 05/11/2018 bcg Wt - 187 - no needs No calc supp/mv 48 hrs prior np & antitra @ [facility] Epic order    Standing Status:   Future    Standing Expiration Date:   07/12/2021    Order Specific Question:   Reason for Exam (SYMPTOM  OR DIAGNOSIS REQUIRED)    Answer:   Menopause.  Osteopenia.    Order Specific Question:   Is the patient pregnant?    Answer:   No    Order Specific Question:   Preferred imaging location?    Answer:   [facility]    Brock Bad, MD 07/12/2020 12:06 PM

## 2020-07-16 LAB — CYTOLOGY - PAP
Comment: NEGATIVE
Diagnosis: NEGATIVE
High risk HPV: NEGATIVE

## 2020-10-19 ENCOUNTER — Other Ambulatory Visit: Payer: Medicare Other

## 2021-07-04 ENCOUNTER — Other Ambulatory Visit: Payer: Self-pay

## 2021-07-04 ENCOUNTER — Ambulatory Visit
Admission: RE | Admit: 2021-07-04 | Discharge: 2021-07-04 | Disposition: A | Discharge status: Home / Self Care | Payer: Medicare Other | Source: Ambulatory Visit | Attending: Obstetrics | Admitting: Obstetrics

## 2021-07-04 DIAGNOSIS — E2839 Other primary ovarian failure: Secondary | ICD-10-CM

## 2021-07-16 ENCOUNTER — Ambulatory Visit: Payer: Medicare Other | Admitting: Obstetrics

## 2021-10-13 DIAGNOSIS — M810 Age-related osteoporosis without current pathological fracture: Secondary | ICD-10-CM

## 2021-10-13 HISTORY — DX: Age-related osteoporosis without current pathological fracture: M81.0

## 2022-04-21 DIAGNOSIS — M810 Age-related osteoporosis without current pathological fracture: Secondary | ICD-10-CM | POA: Insufficient documentation

## 2022-11-26 ENCOUNTER — Other Ambulatory Visit (HOSPITAL_COMMUNITY)
Admission: RE | Admit: 2022-11-26 | Discharge: 2022-11-26 | Disposition: A | Discharge status: Home / Self Care | Payer: Medicare Other | Source: Ambulatory Visit | Attending: Obstetrics | Admitting: Obstetrics

## 2022-11-26 ENCOUNTER — Encounter: Payer: Self-pay | Admitting: Obstetrics

## 2022-11-26 ENCOUNTER — Ambulatory Visit (INDEPENDENT_AMBULATORY_CARE_PROVIDER_SITE_OTHER): Payer: Medicare Other | Admitting: Obstetrics

## 2022-11-26 VITALS — BP 133/77 | HR 57 | Ht 66.0 in | Wt 193.8 lb

## 2022-11-26 DIAGNOSIS — Z01419 Encounter for gynecological examination (general) (routine) without abnormal findings: Secondary | ICD-10-CM

## 2022-11-26 DIAGNOSIS — E669 Obesity, unspecified: Secondary | ICD-10-CM | POA: Diagnosis not present

## 2022-11-26 DIAGNOSIS — Z780 Asymptomatic menopausal state: Secondary | ICD-10-CM | POA: Diagnosis not present

## 2022-11-26 DIAGNOSIS — Z1151 Encounter for screening for human papillomavirus (HPV): Secondary | ICD-10-CM | POA: Diagnosis not present

## 2022-11-26 NOTE — Progress Notes (Signed)
Pt is in the office for annual Last pap 07/12/2020 Pt reports last mammogram Oct 2023

## 2022-11-26 NOTE — Progress Notes (Signed)
Subjective:        Jill Kerr is a 62 y.o. female here for a routine exam.  Current complaints: None.    Personal health questionnaire:  Is patient Jill Kerr, have a family history of breast and/or ovarian cancer: no Is there a family history of uterine cancer diagnosed at age < 87, gastrointestinal cancer, urinary tract cancer, family member who is a Field seismologist syndrome-associated carrier: no Is the patient overweight and hypertensive, family history of diabetes, personal history of gestational diabetes, preeclampsia or PCOS: no Is patient over 19, have PCOS,  family history of premature CHD under age 20, diabetes, smoke, have hypertension or peripheral artery disease:  no At any time, has a partner hit, kicked or otherwise hurt or frightened you?: no Over the past 2 weeks, have you felt down, depressed or hopeless?: no Over the past 2 weeks, have you felt little interest or pleasure in doing things?:no   Gynecologic History No LMP recorded. Patient has had an ablation. Contraception: post menopausal status Last Pap: 2021. Results were: normal Last mammogram: 2023. Results were: normal  Obstetric History OB History  Gravida Para Term Preterm AB Living  5 3 3   2 3  $ SAB IAB Ectopic Multiple Live Births               # Outcome Date GA Lbr Len/2nd Weight Sex Delivery Anes PTL Lv  5 Term           4 Term           3 Term           2 AB           1 AB             Past Medical History:  Diagnosis Date   Osteoporosis 2023    Past Surgical History:  Procedure Laterality Date   BACK SURGERY     BACK SURGERY     BACK SURGERY     GASTRIC BYPASS     ROTATOR CUFF REPAIR Right    SPINAL FUSION       Current Outpatient Medications:    cyclobenzaprine (FLEXERIL) 10 MG tablet, Take by mouth as needed. , Disp: , Rfl:    ergocalciferol (VITAMIN D2) 1.25 MG (50000 UT) capsule, Take 50,000 Units by mouth once a week. Takes every other week, Disp: , Rfl:    gabapentin  (NEURONTIN) 400 MG capsule, as needed, Disp: , Rfl:    HYDROcodone-acetaminophen (NORCO) 10-325 MG tablet, Take 1 tablet by mouth every 6 (six) hours as needed. (Patient not taking: Reported on 07/30/2017), Disp: 10 tablet, Rfl: 0 Allergies  Allergen Reactions   Penicillins    Tape     Adhesive     Social History   Tobacco Use   Smoking status: Never   Smokeless tobacco: Never  Substance Use Topics   Alcohol use: Yes    Comment: ocassionally    Family History  Problem Relation Age of Onset   Cancer Mother    Hypertension Mother       Review of Systems  Constitutional: negative for fatigue and weight loss Respiratory: negative for cough and wheezing Cardiovascular: negative for chest pain, fatigue and palpitations Gastrointestinal: negative for abdominal pain and change in bowel habits Musculoskeletal:negative for myalgias Neurological: negative for gait problems and tremors Behavioral/Psych: negative for abusive relationship, depression Endocrine: negative for temperature intolerance    Genitourinary:negative for abnormal menstrual periods, genital lesions, hot flashes,  sexual problems and vaginal discharge Integument/breast: negative for breast lump, breast tenderness, nipple discharge and skin lesion(s)    Objective:       BP 133/77   Pulse (!) 57   Ht 5' 6"$  (1.676 m)   Wt 193 lb 12.8 oz (87.9 kg)   BMI 31.28 kg/m  General:   Alert and no distress  Skin:   no rash or abnormalities  Lungs:   clear to auscultation bilaterally  Heart:   regular rate and rhythm, S1, S2 normal, no murmur, click, rub or gallop  Breasts:   normal without suspicious masses, skin or nipple changes or axillary nodes  Abdomen:  normal findings: no organomegaly, soft, non-tender and no hernia  Pelvis:  External genitalia: normal general appearance Urinary system: urethral meatus normal and bladder without fullness, nontender Vaginal: normal without tenderness, induration or  masses Cervix: normal appearance Adnexa: normal bimanual exam Uterus: anteverted and non-tender, normal size   Lab Review Urine pregnancy test Labs reviewed yes Radiologic studies reviewed yes  I have spent a total of 20 minutes of face-to-face time, excluding clinical staff time, reviewing notes and preparing to see patient, ordering tests and/or medications, and counseling the patient.   Assessment:    1. Encounter for gynecological examination with Papanicolaou smear of cervix Rx: - Cytology - PAP( [facility])  2. Postmenopausal - doing well  3. Obesity (BMI 30.0-34.9) - weight reduction recommended     Plan:    Education reviewed: calcium supplements, depression evaluation, low fat, low cholesterol diet, safe sex/STD prevention, self breast exams, and weight bearing exercise. Follow up in: 1 year.    Shelly Bombard, MD 11/26/2022 4:49 PM

## 2022-12-01 LAB — CYTOLOGY - PAP
Comment: NEGATIVE
Diagnosis: NEGATIVE
High risk HPV: NEGATIVE

## 2022-12-30 ENCOUNTER — Ambulatory Visit (INDEPENDENT_AMBULATORY_CARE_PROVIDER_SITE_OTHER): Payer: Medicare Other | Admitting: Dermatology

## 2022-12-30 ENCOUNTER — Encounter: Payer: Self-pay | Admitting: Dermatology

## 2022-12-30 VITALS — BP 135/87 | HR 62

## 2022-12-30 DIAGNOSIS — L668 Other cicatricial alopecia: Secondary | ICD-10-CM

## 2022-12-30 DIAGNOSIS — L649 Androgenic alopecia, unspecified: Secondary | ICD-10-CM | POA: Diagnosis not present

## 2022-12-30 DIAGNOSIS — L818 Other specified disorders of pigmentation: Secondary | ICD-10-CM

## 2022-12-30 MED ORDER — BETAMETHASONE DIPROP-MINOXIDIL 0.05-7 % EX SOLN: CUTANEOUS | 2 refills | Status: DC

## 2022-12-30 NOTE — Progress Notes (Signed)
New Patient Visit  Subjective  Jill Kerr is a 62 y.o. female who presents for the following: burning (Burning and itching at scalp, painful but no actual sores or anything visual. Patient has also had hair loss. She did cut all of her hair in December. About 3 weeks ago she switched products and scalp seems to have calmed down. Patient has never been prescribed anything to treat scalp. She has had soreness at scalp for many years but it would go away so she assumed it was her hair growing. Over the last year it has become unbearable for patient. No association with perms, color. ).  Patient also with white spots all over the legs. Asymptomatic.   The following portions of the chart were reviewed this encounter and updated as appropriate:   Tobacco  Allergies  Meds  Problems  Med Hx  Surg Hx  Fam Hx      Review of Systems:  No other skin or systemic complaints except as noted in HPI or Assessment and Plan.  Objective  Well appearing patient in no apparent distress; mood and affect are within normal limits.  A focused examination was performed including scalp, legs. Relevant physical exam findings are noted in the Assessment and Plan.  Scalp Pattern Thinning on bilateral temples and vertex  lower legs Hypopigmented macules   Scalp Hair loss with clinical signs of scarring     Assessment & Plan  Androgenetic alopecia Scalp  Patient Counseling -Explained to patient that this is caused by genetic predisposition and hormonal changes -We cannot change genetics to "cure" this condition but we can treat with a combination of topical and sometimes oral medications -Treatment is most effective at preventing additional hair loss and we cannot predict exactly how much hair already lost will grow back - First line topical tx's include: topical minoxidil, finasteride, spironolactone, or dutasteride -Oral teatments include: oral minoxidil, oral finasteride, oral  spironolactone -Other interventions: PRP, red light, hair transplant   Patient's treatment plan:  the minoxidil in CCCA compound will also help with this form of hairloss.  Idiopathic guttate hypomelanosis lower legs  IGH Patient Counseling  -Reassured pt that these lesions are benign areas of loss of pigment -Caused by over exposure to sun during ones lifetime -Must practice safe sun habits (spf 30, sun avoidence to prevent more from developing) -No available treatment -Reassurance    Central centrifugal scarring alopecia Scalp  Counseled the patient on the following:  The goal of therapy is to halt progression of disease and prevent further hair loss. In areas where the hair follicle has been replaced with fibrosis, regrowth is not possible. As the exact cause is not known, targeted therapy for CCCA is not available.  Treatment options for CCCA include anti-inflammatory agents such as:  -Potent topical steroids (eg clobetasol) or intralesional steroids -Calcineurin inhibitors: tacrolimus ointment, pimecrolimus cream -Tetracyclines (eg doxycycline 100 mg twice daily, taken for several weeks to months) -Hydroxychloroquine -Hair transplantation can be considered in individuals with well-controlled CCCA for at least one year. However, graft survival is low.  Discontinuation of traumatic hair care practices is an essential aspect of treatment of CCCA.--> Pt currently natural and does not relax her hair or wear tight styles  Continue to Avoid tight braids and weaves/extensions Continue Avoid hair style practices associated with discomfort, scalp irritation or scale Hair transplantation can be considered in individuals with well-controlled CCCA that has been stable for at least one year. However, graft survival is low.  Favor CCCA  Treatment plan started today:Start betamethasone-minoxidil once daily in the morning to affected areas at scalp.  Plan for next visit: Consider ILK  injections.   Betamethasone Diprop-Minoxidil 0.05-7 % SOLN - Scalp Apply once daily to scalp in the morning   Return in about 3 months (around 04/01/2023) for CCCA follow up.  Graciella Belton, RMA, am acting as scribe for Ellard Artis, MD .

## 2022-12-30 NOTE — Patient Instructions (Addendum)
Prescription Information:  Your prescription went to the following pharmacy. If you don't hear form them in the next 24 hours, please call the number below.  Address: [address], [place] [address] Areas served: [place] and nearby areas Hours:  Open ? Closes 5?PM Updated by others 2 weeks ago Phone: [phone]  Start betamethasone-minoxidil once daily in the morning to affected areas of scalp. Avoid applying to face, groin, and axilla. Use as directed. Long-term use can cause thinning of the skin.  Topical steroids (such as triamcinolone, fluocinolone, fluocinonide, mometasone, clobetasol, halobetasol, betamethasone, hydrocortisone) can cause thinning and lightening of the skin if they are used for too long in the same area. Your physician has selected the right strength medicine for your problem and area affected on the body. Please use your medication only as directed by your physician to prevent side effects.    What is central centrifugal cicatricial alopecia? Central Centrifugal Cicatricial Alopecia (CCCA) is a form of scarring alopecia on the scalp that results in permanent hair loss. It is the most common form of scarring hair loss seen in black women. However, it may be seen in men and among persons of all races and hair colour (though rarely). Middle-aged women are most commonly affected.  What is the cause of central centrifugal cicatricial alopecia? The exact cause of CCCA is unknown and is likely multifactorial. A genetic component has been suggested, with a link to mutations of the gene PADI3, which encodes peptidyl arginine deiminase, type III (PADI3), an enzyme that modifies proteins that are essential to formation of the hair-shaft. Hair care practices, such as the use of the hot comb, relaxers, tight extensions and weaves, have been implicated for decades, but studies have not shown a consistent link. Other proposed causative factors include fungal infections,  bacterial infections, autoimmune disease, and genetics. One study has shown an association with medical conditions such as type 2 diabetes mellitus.  What patients often ask their dermatologist about [place] Board-certified dermatologists are the medical doctors who have the most experience diagnosing and treating hair loss, including CCCA. When patients see their dermatologist about CCCA, they often ask the following questions.  Why am I going bald in the center of my head? This type of hair loss often begins in the center of the scalp as a small, balding, and round patch that grows over time.  While more common in Black women, this type of hair loss develops in men and people of all races.  Central centrifugal cicatricial alopecia (CCCA) The first sign of CCCA is often noticeable hair loss in the center, or crown, of your scalp.  Woman with CCCA has hair loss in the center of her scalp If you have this type of hair loss, you want to treat it early. Starting treatment early can prevent CCCA from spreading outward and causing more permanent hair loss. Some people also have hair regrowth when treatment starts early.  Early treatment is important because this disease destroys hair follicles. These are tiny pores (or openings) in your scalp from which your hair grows. Once a hair follicle has been destroyed, it is replaced by scar tissue. This is why hair loss can be permanent.  You can tell when scarring develops by looking at your scalp. After many hair follicles develop scars, you'll have a bald area that feels smooth to the touch.  Can CCCA be reversed? You may be able to reverse (or grow some hair) if you treat  CCCA early before hair follicles develop scars. Once a hair follicle scars completely, treatment to regrow hair becomes difficult and hair loss is more likely to be permanent.  While treatment for CCCA may not always be able to reverse the disease and regrow hair, treatment can prevent  CCCA from destroying more hair follicles. This means that the patch of hair loss that you have can remain the same size instead of getting larger. Without treatment, CCCA often continues to destroy hair follicles, and the patch of hair loss becomes larger and may eventually involve most of the scalp.  How is CCCA treated? You cannot effectively treat CCCA with hair loss treatments that you can buy online or in stores. A dermatologist must prescribe medication to treat this type of hair loss.  A dermatologist can also give you self-care tips that can make treatment more effective.  Even if you don't want to treat the hair loss, it's important to see a board-certified dermatologist if you have patchy hair loss in the center of your head. Occasionally, CCCA is a sign of a medical problem like a thyroid condition. The hair loss could also be a sign that you need more iron or certain vitamins.  If you have noticeable hair loss on the top of your head, dermatologists encourage you to make an appointment today. As tempting as it can be to hide a small area of hair loss, remember that the small area tends to get larger and larger without treatment.  Why does the baldness start from the top of the head? Exactly why CCCA usually starts on the top of the head is not completely understood.  In studying CCCA, dermatologists have learned that it is a unique type of hair loss that usually starts on the top of the head. As CCCA progresses, the round patch grows.  Studies have also found that:  Where this type of hair loss develops, there's inflammation.  CCCA is the most common type of scarring hair loss for women of African descent.  In the [place], it's the most frequent cause of scarring hair loss in [place] American women and usually begins during middle age.  CCCA runs in families.  Noticeable hair loss is one sign of CCCA. Some people feel small, raised bumps on their scalp. Many people who  have untreated CCCA say that their scalp burns, stings, or itches.  You may develop other signs or symptoms. You'll find more information about these, along with pictures at Central centrifugal cicatricial alopecia: Symptoms.

## 2022-12-31 ENCOUNTER — Telehealth: Payer: Self-pay

## 2022-12-31 NOTE — Telephone Encounter (Signed)
Spoke with patient regarding betamethasone-minoxidil RX sent in. Pharmacy stated RX was not covered and needing PA. Per Dr. Shanon Brow this should be a "cash pay" option since it is a compound. Spoke with pharmacy and they apologized for the mistake and they would correct this prescription and call patient back.   Patient also asked for another anti-inflammatory. Dr. Shanon Brow would like for patient to use topical solution first and discuss alternatives if needed at June follow up. Patient advised. aw

## 2023-04-01 ENCOUNTER — Ambulatory Visit (INDEPENDENT_AMBULATORY_CARE_PROVIDER_SITE_OTHER): Payer: Medicare Other | Admitting: Dermatology

## 2023-04-01 DIAGNOSIS — L668 Other cicatricial alopecia: Secondary | ICD-10-CM

## 2023-04-01 DIAGNOSIS — L649 Androgenic alopecia, unspecified: Secondary | ICD-10-CM

## 2023-04-01 MED ORDER — TRIAMCINOLONE ACETONIDE 10 MG/ML IJ SUSP
10.0000 mg | Freq: Once | INTRAMUSCULAR | Status: AC
  Administered 2023-04-01: 10 mg

## 2023-04-01 NOTE — Progress Notes (Signed)
   Follow-Up Visit   Subjective  Jill Kerr is a 62 y.o. female who presents for the following: CCCA  Patient present today for follow up visit for CCCA. Patient was last evaluated on 12/30/22. Patient reports sxs are Better but not at goal. Patient reports no medication changes. At the previous office visit she was precribed Betamethasone Diprop-Minoxidil 0.05-7 % SOLN - Scalp Apply once daily to scalp in the morning. She feels this has worked very well for her and feels it haas definitely helped with the soreness and irration at her scalp.  The following portions of the chart were reviewed this encounter and updated as appropriate: medications, allergies, medical history  Review of Systems:  No other skin or systemic complaints except as noted in HPI or Assessment and Plan.  Objective  Well appearing patient in no apparent distress; mood and affect are within normal limits.  A focused examination was performed of the following areas: Scalp  Relevant exam findings are noted in the Assessment and Plan.  Scalp CCCA           Assessment & Plan   Central centrifugal scarring alopecia Scalp   Counseled the patient on the following:   The goal of therapy is to halt progression of disease and prevent further hair loss. In areas where the hair follicle has been replaced with fibrosis, regrowth is not possible. As the exact cause is not known, targeted therapy for CCCA is not available.   Treatment options for CCCA include anti-inflammatory agents such as:   -Potent topical steroids (eg clobetasol) or intralesional steroids -Calcineurin inhibitors: tacrolimus ointment, pimecrolimus cream -Tetracyclines (eg doxycycline 100 mg twice daily, taken for several weeks to months) -Hydroxychloroquine -Hair transplantation can be considered in individuals with well-controlled CCCA for at least one year. However, graft survival is low.   Discontinuation of traumatic hair care  practices is an essential aspect of treatment of CCCA.--> Pt currently natural and does not relax her hair or wear tight styles   Continue to Avoid tight braids and weaves/extensions Continue Avoid hair style practices associated with discomfort, scalp irritation or scale Hair transplantation can be considered in individuals with well-controlled CCCA that has been stable for at least one year. However, graft survival is low.   Favor CCCA  Treatment Plan: - Continue Betamethasone Diprop-Minoxidil 0.05-7 % SOLN - Scalp Apply once daily to scalp in the morning - ILK injections  Central centrifugal scarring alopecia Scalp  Symptomatic, irritating, patient would like treated.  Benign-appearing.  Call clinic for new or changing lesions.   Prior to procedure, discussed risks of blister formation, small wound, skin dyspigmentation, or rare scar following treatment. Recommend Vaseline ointment to treated areas while healing.   triamcinolone acetonide (KENALOG) 10 MG/ML injection 10 mg - Scalp   Related Medications Betamethasone Diprop-Minoxidil 0.05-7 % SOLN Apply once daily to scalp in the morning   Return in about 3 months (around 07/02/2023) for CCCA F/U.  ***  Documentation: I have reviewed the above documentation for accuracy and completeness, and I agree with the above.  Langston Reusing, DO

## 2023-04-01 NOTE — Patient Instructions (Addendum)
Today you received Intralesional Kenalog Injections due to symptomatic CCCA (Code: J3301)   Due to recent changes in healthcare laws, you may see results of your pathology and/or laboratory studies on MyChart before the doctors have had a chance to review them. We understand that in some cases there may be results that are confusing or concerning to you. Please understand that not all results are received at the same time and often the doctors may need to interpret multiple results in order to provide you with the best plan of care or course of treatment. Therefore, we ask that you please give [place] 2 business days to thoroughly review all your results before contacting the office for clarification. Should we see a critical lab result, you will be contacted sooner.   If You Need Anything After Your Visit  If you have any questions or concerns for your doctor, please call our main line at [phone] If no one answers, please leave a voicemail as directed and we will return your call as soon as possible. Messages left after 4 pm will be answered the following business day.   You may also send [place] a message via MyChart. We typically respond to MyChart messages within 1-2 business days.  For prescription refills, please ask your pharmacy to contact our office. Our fax number is [phone].  If you have an urgent issue when the clinic is closed that cannot wait until the next business day, you can page your doctor at the number below.    Please note that while we do our best to be available for urgent issues outside of office hours, we are not available 24/7.   If you have an urgent issue and are unable to reach [place], you may choose to seek medical care at your doctor's office, retail clinic, urgent care center, or emergency room.  If you have a medical emergency, please immediately call 911 or go to the emergency department. In the event of inclement weather, please call our main line at [phone]  for an update on the status of any delays or closures.  Dermatology Medication Tips: Please keep the boxes that topical medications come in in order to help keep track of the instructions about where and how to use these. Pharmacies typically print the medication instructions only on the boxes and not directly on the medication tubes.   If your medication is too expensive, please contact our office at [phone] or send [place] a message through MyChart.   We are unable to tell what your co-pay for medications will be in advance as this is different depending on your insurance coverage. However, we may be able to find a substitute medication at lower cost or fill out paperwork to get insurance to cover a needed medication.   If a prior authorization is required to get your medication covered by your insurance company, please allow [place] 1-2 business days to complete this process.  Drug prices often vary depending on where the prescription is filled and some pharmacies may offer cheaper prices.  The website www.goodrx.com contains coupons for medications through different pharmacies. The prices here do not account for what the cost may be with help from insurance (it may be cheaper with your insurance), but the website can give you the price if you did not use any insurance.  - You can print the associated coupon and take it with your prescription to the pharmacy.  - You may also stop by our office during regular business  hours and pick up a GoodRx coupon card.  - If you need your prescription sent electronically to a different pharmacy, notify our office through [facility] [place] or by phone at [phone]

## 2023-07-02 ENCOUNTER — Ambulatory Visit: Payer: Medicare Other | Admitting: Dermatology

## 2023-07-08 ENCOUNTER — Other Ambulatory Visit: Payer: Self-pay

## 2023-07-08 DIAGNOSIS — L668 Other cicatricial alopecia: Secondary | ICD-10-CM

## 2023-07-08 MED ORDER — BETAMETHASONE DIPROP-MINOXIDIL 0.05-7 % EX SOLN: CUTANEOUS | 1 refills | Status: DC

## 2023-07-08 NOTE — Progress Notes (Signed)
Pt has appt needed refll

## 2023-07-09 ENCOUNTER — Other Ambulatory Visit: Payer: Self-pay | Admitting: Dermatology

## 2023-07-09 DIAGNOSIS — L668 Other cicatricial alopecia: Secondary | ICD-10-CM

## 2023-08-20 ENCOUNTER — Ambulatory Visit: Payer: Medicare Other | Admitting: Dermatology

## 2023-08-20 VITALS — BP 96/64 | HR 69

## 2023-08-20 DIAGNOSIS — B351 Tinea unguium: Secondary | ICD-10-CM | POA: Diagnosis not present

## 2023-08-20 DIAGNOSIS — L649 Androgenic alopecia, unspecified: Secondary | ICD-10-CM | POA: Diagnosis not present

## 2023-08-20 DIAGNOSIS — L6681 Central centrifugal cicatricial alopecia: Secondary | ICD-10-CM | POA: Diagnosis not present

## 2023-08-20 MED ORDER — JUBLIA 10 % EX SOLN: CUTANEOUS | 1 refills | Status: AC

## 2023-08-20 MED ORDER — JUBLIA 10 % EX SOLN: CUTANEOUS | Status: DC

## 2023-08-20 NOTE — Patient Instructions (Addendum)
Hello [place],  Thank you for visiting [place] today. We appreciate your commitment to improving your health and effectively managing your conditions. Here is a summary of the key instructions from today's consultation:  - Betamethasone Minoxidil Drops: Continue using the drops on your temples and the top of your head every morning for the next four months.   - Transition: After four months, we will switch to a new formulation that contains only Minoxidil. 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  - Nail Condition: Finish your current prescription of Lamisil for your nail condition. Once completed, start using Jublia topically daily to prevent fungal recurrence.  - Follow-Up: Please ensure to schedule a follow-up appointment in four months to assess progress and make necessary adjustments to your treatment plan.  Thank you once again for trusting [place] with your care. We look forward to supporting you through your treatment journey. If you have any questions or concerns before your next visit, please do not hesitate to contact our office.  Warm regards,  Dr. Langston Reusing [facility]    Important Information   Due to recent changes in healthcare laws, you may see results of your pathology and/or laboratory studies on MyChart before the doctors have had a chance to review them. We understand that in some cases there may be results that are confusing or concerning to you. Please understand that not all results are received at the same time and often the doctors may need to interpret multiple results in order to provide you with the best plan of care or course of treatment. Therefore, we ask that you please give [place] 2 business days to thoroughly review all your results before contacting the office for clarification. Should we see a critical lab result, you will be contacted sooner.     If You Need Anything After Your Visit   If you have any  questions or concerns for your doctor, please call our main line at [phone]. If no one answers, please leave a voicemail as directed and we will return your call as soon as possible. Messages left after 4 pm will be answered the following business day.    You may also send [place] a message via MyChart. We typically respond to MyChart messages within 1-2 business days.  For prescription refills, please ask your pharmacy to contact our office. Our fax number is [phone].  If you have an urgent issue when the clinic is closed that cannot wait until the next business day, you can page your doctor at the number below.     Please note that while we do our best to be available for urgent issues outside of office hours, we are not available 24/7.    If you have an urgent issue and are unable to reach [place], you may choose to seek medical care at your doctor's office, retail clinic, urgent care center, or emergency room.   If you have a medical emergency, please immediately call 911 or go to the emergency department. In the event of inclement weather, please call our main line at [phone] for an update on the status of any delays or closures.  Dermatology Medication Tips: Please keep the boxes that topical medications come in in order to help keep track of the instructions about where and how to use these. Pharmacies typically print the medication instructions only on the boxes and not directly on the medication tubes.   If your medication is  too expensive, please contact our office at [phone] or send [place] a message through MyChart.    We are unable to tell what your co-pay for medications will be in advance as this is different depending on your insurance coverage. However, we may be able to find a substitute medication at lower cost or fill out paperwork to get insurance to cover a needed medication.    If a prior authorization is required to get your medication covered by your insurance  company, please allow [place] 1-2 business days to complete this process.   Drug prices often vary depending on where the prescription is filled and some pharmacies may offer cheaper prices.   The website www.goodrx.com contains coupons for medications through different pharmacies. The prices here do not account for what the cost may be with help from insurance (it may be cheaper with your insurance), but the website can give you the price if you did not use any insurance.  - You can print the associated coupon and take it with your prescription to the pharmacy.  - You may also stop by our office during regular business hours and pick up a GoodRx coupon card.  - If you need your prescription sent electronically to a different pharmacy, notify our office through [facility] or by phone at [phone]

## 2023-08-20 NOTE — Progress Notes (Signed)
   Follow-Up Visit   Subjective  Jill Kerr is a 62 y.o. female who presents for the following: CCCA  Patient present today for follow up visit for CCCA. Patient was last evaluated on 04/01/23. Patient reports sxs are  improving .She states she hasn't had any additional tenderness.Patient reports she also has nail concerns she would like to discuss. Patient reports she has noticed nail changes and her PCP prescribed Lamisil for 12 weeks and she hasn't seen much improvement. Patient denies medication changes.  The following portions of the chart were reviewed this encounter and updated as appropriate: medications, allergies, medical history  Review of Systems:  No other skin or systemic complaints except as noted in HPI or Assessment and Plan.  Objective  Well appearing patient in no apparent distress; mood and affect are within normal limits.  A focused examination was performed of the following areas: Scalp & Finger Nails   Relevant exam findings are noted in the Assessment and Plan.                  Assessment & Plan   1. CCCA and Androgenetic Alopecia - Assessment: Currently being treated with betamethasone minoxidil drops. - Plan: Continue betamethasone minoxidil drops in the morning for the next 4 months. Reevaluate in 4 months and consider switching to a formulation without betamethasone. If itching, stinging, or burning occurs, reintroduce steroids for a couple of months and then taper off.  2. Onychomycosis - Assessment: distal toe nails thick yellow appearance - Plan: Finish the remaining Lamisil course. Start topical Jublia treatment after completing Lamisil to prevent recurrence of fungal infection.    . General Recommendations - Plan: Maintain a healthy lifestyle and avoid excessive sun exposure. Follow up in 4 months for reevaluation of alopecia treatment and overall skin health.   No follow-ups on file.    Documentation: I have reviewed the above  documentation for accuracy and completeness, and I agree with the above.  I, Shirron Marcha Solders, am acting as scribe for [employer], DO.   Langston Reusing, DO

## 2023-10-20 ENCOUNTER — Other Ambulatory Visit: Payer: Self-pay | Admitting: Dermatology

## 2023-10-20 ENCOUNTER — Encounter: Payer: Self-pay | Admitting: Dermatology

## 2023-10-20 ENCOUNTER — Ambulatory Visit: Payer: Medicare Other | Admitting: Dermatology

## 2023-10-20 DIAGNOSIS — L03012 Cellulitis of left finger: Secondary | ICD-10-CM

## 2023-10-20 DIAGNOSIS — L603 Nail dystrophy: Secondary | ICD-10-CM | POA: Diagnosis not present

## 2023-10-20 DIAGNOSIS — L6681 Central centrifugal cicatricial alopecia: Secondary | ICD-10-CM

## 2023-10-20 DIAGNOSIS — L659 Nonscarring hair loss, unspecified: Secondary | ICD-10-CM

## 2023-10-20 DIAGNOSIS — B351 Tinea unguium: Secondary | ICD-10-CM

## 2023-10-20 DIAGNOSIS — L649 Androgenic alopecia, unspecified: Secondary | ICD-10-CM

## 2023-10-20 MED ORDER — DOXYCYCLINE MONOHYDRATE 100 MG PO CAPS: 100.0000 mg | ORAL_CAPSULE | Freq: Two times a day (BID) | ORAL | 0 refills | Status: AC

## 2023-10-20 MED ORDER — SAFETY SEAL MISCELLANEOUS MISC: 5 refills | Status: AC

## 2023-10-20 NOTE — Patient Instructions (Signed)
 Hello [place],  Thank you for visiting my office today. Your dedication to addressing your health concerns is greatly appreciated, and I am committed to assisting you in improving your condition. Here is a summary of the key instructions and next steps from today's consultation:  Nail Clippings: Clippings of your affected nails will be sent for culture to determine the presence of fungus.  Medication: You are prescribed Doxycycline , to be taken one tablet twice a day for 10 days. This medication is aimed at addressing the throbbing, deeper pain, and swelling.  Topical Treatment: A new prescription for a clobetasol-minoxidil  gel will be sent to [facility]. You have 5 refills available.  Follow-Up: Please return for a follow-up appointment in 3 months. At this visit, we will review the progress of your hair loss and fingernail condition.  Communication: I will contact you via MyChart with the results of the nail culture.  Please ensure to follow the prescribed medication regimen and keep me updated on any changes in your symptoms. If you have any questions or concerns before our next appointment, please do not hesitate to reach out.  Best regards,  Dr. Delon Lenis [facility]

## 2023-10-20 NOTE — Progress Notes (Signed)
   Follow-Up Visit   Subjective  Jill Kerr is a 63 y.o. female who presents for the following: fingernails. Has taken 2 rounds of oral Lamisil.  Used Jublia  as directed. Stated it did not help. Fingernails started looking worse while using.  Patient states she would like a biopsy or culture of nails to get a diagnosis. C/O throbbing and burning sensation sometimes at left 5th fingernail. Denies illness or medical procedures over the past few months.   No personal Hx of psoriasis.   The following portions of the chart were reviewed this encounter and updated as appropriate: medications, allergies, medical history  Review of Systems:  No other skin or systemic complaints except as noted in HPI or Assessment and Plan.  Objective  Well appearing patient in no apparent distress; mood and affect are within normal limits.  A focused examination was performed of the following areas: fingernails  Relevant exam findings are noted in the Assessment and Plan.        Assessment & Plan    Onychomycosis with Nail Dystrophy and Paronychia of left pinky Assessment: Patient presents with ongoing nail issues, including pain, stinging, throbbing, and burning sensations, along with color changes in one nail. Previous treatments with oral and topical antifungals, including Lamisil, have been ineffective. Suspected nail dystrophy due to lack of response to antifungal treatments. Plan:   Collect nail clippings for fungal culture to rule out fungal involvement.   Prescribe doxycycline , 1 tablet twice daily for 10 days, to address potential soft tissue inflammation.   Consider X-ray to evaluate bone involvement if doxycycline  does not alleviate symptoms.   Follow up with culture results via MyChart message.   Schedule a 9-month follow-up appointment for nail reassessment.  Alopecia Assessment: Patient has been using topical treatments for hair loss, including a betamethasone -minoxidil  solution  from [facility]. A new prescription is needed for a different formulation. Plan:   Prescribe clobetasol-minoxidil  gel with 5 refills.   Send prescription to [facility] for mail delivery.   Schedule a 68-month follow-up appointment for hair loss reassessment.  Paronychia  Exam: inflammation at fingernail folds, L5, R4, 5.  Treatment: Take Doxycycline  100 mg twice daily with food for 10 days  Doxycycline  should be taken with food to prevent nausea. Do not lay down for 30 minutes after taking. Be cautious with sun exposure and use good sun protection while on this medication. Pregnant women should not take this medication.    ONYCHOMYCOSIS   Related Procedures Fungus Culture W/Rfx Rapid ID Related Medications Efinaconazole  (JUBLIA ) 10 % SOLN Use on affected nails daily.  Return in about 3 months (around 01/18/2024) for Hair loss, Fingernail recheck.  I, Jill Parcell, CMA, am acting as scribe for [employer], DO.   Documentation: I have reviewed the above documentation for accuracy and completeness, and I agree with the above.  Delon Lenis, DO

## 2023-11-02 LAB — FUNGUS CULTURE W/RFX RAPID ID: Fungal Culture W/Rfx: POSITIVE — AB

## 2023-11-02 LAB — FUNGAL ID BY MOLECULAR METHODS

## 2023-11-17 ENCOUNTER — Telehealth: Payer: Self-pay

## 2023-11-17 ENCOUNTER — Other Ambulatory Visit: Payer: Self-pay

## 2023-11-17 MED ORDER — FLUCONAZOLE 100 MG PO TABS: 100.0000 mg | ORAL_TABLET | ORAL | 0 refills | Status: AC

## 2023-11-17 NOTE — Telephone Encounter (Signed)
 Patient called today. She says the Clobetasol/Minoxidil  is not helping. The Betamethasone /Minoxidil  that was sent to [facility] in September worked much better - instantly helped with the pain. She was also given doxycycline  for paronychia and and she states that it is not helping and she is still having some soreness/pain. She is using Jublia , though she does not think it helps much, and if she needs to continue, she will need a refill.

## 2023-11-17 NOTE — Telephone Encounter (Signed)
 Ok we can resend the rx for Clobetasol/Minoxidil  to [facility].  Yes, she should continue applying the Jublia  because the nail clipping was positive for a fungus when cultured.  We can also send a script for oral Fluconazole  100mg , take one tablet once a week for 6 weeks.  That should help with both the nail fungus and paronychia.  She still needs to continue Jublia .  -Dr. Alm

## 2024-01-21 ENCOUNTER — Encounter: Payer: Self-pay | Admitting: Dermatology

## 2024-01-21 ENCOUNTER — Other Ambulatory Visit: Payer: Self-pay | Admitting: Dermatology

## 2024-01-21 ENCOUNTER — Ambulatory Visit (INDEPENDENT_AMBULATORY_CARE_PROVIDER_SITE_OTHER): Payer: Medicare Other | Admitting: Dermatology

## 2024-01-21 VITALS — BP 129/80

## 2024-01-21 DIAGNOSIS — B351 Tinea unguium: Secondary | ICD-10-CM | POA: Diagnosis not present

## 2024-01-21 MED ORDER — ITRACONAZOLE 100 MG PO CAPS
200.0000 mg | ORAL_CAPSULE | Freq: Every day | ORAL | 1 refills | Status: AC
Start: 2024-01-21 — End: ?

## 2024-01-21 MED ORDER — TRIAMCINOLONE ACETONIDE 0.1 % EX CREA: TOPICAL_CREAM | Freq: Two times a day (BID) | CUTANEOUS | 5 refills | Status: DC

## 2024-01-21 NOTE — Patient Instructions (Addendum)
 Hello Jill Kerr,  Thank you for visiting today. Here is a summary of the key instructions:  - Medications: Take itraconazole 200 mg once a day for 12 weeks   - Use topical ointment with nystatin and triamcinolone:   - Apply three times a day for three weeks   - Take a two-week break   - For maintenance, use at least three times a week   - Use anytime you feel tenderness or itching  - Lab Tests:   - Complete blood count (CBC)   - Comprehensive metabolic panel (CMP)   - Must be done before starting itraconazole   - Will be repeated after starting itraconazole  - Lifestyle Changes: Wear gloves when washing dishes and cleaning  - Follow-up:   - Start itraconazole only if liver function test results are normal   - We will monitor your progress and adjust treatment as needed  We look forward to seeing the positive changes in your next visit. If you have any questions or concerns before then, please do not hesitate to contact our office.  Warm regards,  Dr. Langston Reusing, Dermatology      Important Information  Due to recent changes in healthcare laws, you may see results of your pathology and/or laboratory studies on MyChart before the doctors have had a chance to review them. We understand that in some cases there may be results that are confusing or concerning to you. Please understand that not all results are received at the same time and often the doctors may need to interpret multiple results in order to provide you with the best plan of care or course of treatment. Therefore, we ask that you please give [place] 2 business days to thoroughly review all your results before contacting the office for clarification. Should we see a critical lab result, you will be contacted sooner.   If You Need Anything After Your Visit  If you have any questions or concerns for your doctor, please call our main line at [phone] If no one answers, please leave a voicemail as directed and we will  return your call as soon as possible. Messages left after 4 pm will be answered the following business day.   You may also send [place] a message via MyChart. We typically respond to MyChart messages within 1-2 business days.  For prescription refills, please ask your pharmacy to contact our office. Our fax number is [phone].  If you have an urgent issue when the clinic is closed that cannot wait until the next business day, you can page your doctor at the number below.    Please note that while we do our best to be available for urgent issues outside of office hours, we are not available 24/7.   If you have an urgent issue and are unable to reach [place], you may choose to seek medical care at your doctor's office, retail clinic, urgent care center, or emergency room.  If you have a medical emergency, please immediately call 911 or go to the emergency department. In the event of inclement weather, please call our main line at [phone] for an update on the status of any delays or closures.  Dermatology Medication Tips: Please keep the boxes that topical medications come in in order to help keep track of the instructions about where and how to use these. Pharmacies typically print the medication instructions only on the boxes and not directly on the medication tubes.   If your medication is too expensive,  please contact our office at [phone] or send [place] a message through MyChart.   We are unable to tell what your co-pay for medications will be in advance as this is different depending on your insurance coverage. However, we may be able to find a substitute medication at lower cost or fill out paperwork to get insurance to cover a needed medication.   If a prior authorization is required to get your medication covered by your insurance company, please allow [place] 1-2 business days to complete this process.  Drug prices often vary depending on where the prescription is filled and some pharmacies  may offer cheaper prices.  The website www.goodrx.com contains coupons for medications through different pharmacies. The prices here do not account for what the cost may be with help from insurance (it may be cheaper with your insurance), but the website can give you the price if you did not use any insurance.  - You can print the associated coupon and take it with your prescription to the pharmacy.  - You may also stop by our office during regular business hours and pick up a GoodRx coupon card.  - If you need your prescription sent electronically to a different pharmacy, notify our office through [facility] or by phone at [phone]

## 2024-01-21 NOTE — Progress Notes (Signed)
 Follow-Up Visit   Subjective  Jill Kerr is a 63 y.o. female who presents for the following: fingernail check   Patient present today for follow up visit for finger nail check . Patient was last evaluated on 10/20/2023. At this visit patient was prescribed clobetasol-minoxidil gel . Patient reports sxs are worse. Patient denies medication changes. Patient stated she has used the prescribe Jublia  10% on the fingers . Patient stated that she has pain in the fingers , they were swollen over the weekend.  The following portions of the chart were reviewed this encounter and updated as appropriate: medications, allergies, medical history  Review of Systems:  No other skin or systemic complaints except as noted in HPI or Assessment and Plan.  Objective  Well appearing patient in no apparent distress; mood and affect are within normal limits.  A focused examination was performed of the following areas: Fingernail   Relevant exam findings are noted in the Assessment and Plan.         Nail Culture Results  1 Result Note    Component Ref Range & Units (hover) 3 mo ago  Report Status Final report  Fungal Culture W/Rfx Positive Abnormal   Reflex to ID Comment  Comment: Fungal identification(s) to follow, see below.  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 Symptoms returned 2 weeks after discontinuation. Recent culture confirmed fungal etiology. Jublia caused skin irritation. Itraconazole is now considered the medication of choice for this infection.  - Plan:    Fungal culture results  reviewed in detail with pt    Switch to oral itraconazole 200 mg once daily for 12 weeks    Prescribe topical ointment containing nystatin and triamcinolone for skin soothing and inflammation reduction    Apply topical ointment TID for 3 weeks, followed by 2-week break    Maintenance: apply topical ointment at least 3 times weekly and PRN for tenderness or itching    Obtain baseline CMP prior to initiating itraconazole    Repeat CMP after initiating itraconazole    Perform CBC    Advise patient to wear gloves when washing dishes and cleaning    Monitor progress and adjust treatment as needed  Side effects itraconazole include nausea, diarrhea, headache, dizziness, taste changes, muscle pain or cramps, numbness or tingling in the hands, feet, or lips, rare risk of irritation of the liver, allergy, or decreased blood counts (which could show up as infection or tiredness). Also should not be used if history of heart disease, congestive heart failure, irregular heartbeat, COPD or kidney disease.  It has frequent drug-drug interactions, some which are severe.  Do not take with "statin" anti-cholesterol medications.  ONYCHOMYCOSIS   Related Procedures CMP CBC Related Medications Efinaconazole (JUBLIA) 10 % SOLN Use on affected nails daily. itraconazole (SPORANOX) 100 MG capsule Take 2 capsules (200 mg total) by mouth daily. ketoconazole 2%-triamcinolone 0.1% 1:2 cream mixture Apply topically 2 (two) times daily. Apply 2 times daily for 3 weeks then STOP for 3 weeks then resume for 3 weeks. Continue to alternate every 3  weeks.  No follow-ups on file.  Exie Holler, CMA, am acting as scribe for [employer], DO.   Documentation: I have reviewed the above documentation for accuracy and completeness, and I agree with the above.  Jill Roup, DO

## 2024-02-02 MED ORDER — KETOCONAZOLE 2 % EX CREA: 1.0000 | TOPICAL_CREAM | Freq: Two times a day (BID) | CUTANEOUS | 5 refills | Status: AC

## 2024-02-02 MED ORDER — TRIAMCINOLONE ACETONIDE 0.1 % EX CREA: 1.0000 | TOPICAL_CREAM | Freq: Two times a day (BID) | CUTANEOUS | 4 refills | Status: AC

## 2024-02-02 NOTE — Addendum Note (Signed)
 Addended byCharlann Confer on: 02/02/2024 04:16 PM   Modules accepted: Orders

## 2024-04-23 ENCOUNTER — Other Ambulatory Visit: Payer: Self-pay | Admitting: Dermatology

## 2024-04-23 DIAGNOSIS — B351 Tinea unguium: Secondary | ICD-10-CM

## 2024-06-17 ENCOUNTER — Ambulatory Visit: Admitting: Obstetrics

## 2024-06-17 ENCOUNTER — Encounter: Payer: Self-pay | Admitting: Obstetrics

## 2024-06-17 ENCOUNTER — Other Ambulatory Visit (HOSPITAL_COMMUNITY)
Admission: RE | Admit: 2024-06-17 | Discharge: 2024-06-17 | Disposition: A | Discharge status: Home / Self Care | Source: Ambulatory Visit | Attending: Obstetrics | Admitting: Obstetrics

## 2024-06-17 VITALS — BP 134/74 | HR 57 | Ht 65.0 in | Wt 201.5 lb

## 2024-06-17 DIAGNOSIS — Z9884 Bariatric surgery status: Secondary | ICD-10-CM | POA: Diagnosis not present

## 2024-06-17 DIAGNOSIS — Z01419 Encounter for gynecological examination (general) (routine) without abnormal findings: Secondary | ICD-10-CM

## 2024-06-17 DIAGNOSIS — Z1151 Encounter for screening for human papillomavirus (HPV): Secondary | ICD-10-CM | POA: Insufficient documentation

## 2024-06-17 DIAGNOSIS — E66811 Obesity, class 1: Secondary | ICD-10-CM | POA: Diagnosis not present

## 2024-06-17 DIAGNOSIS — Z124 Encounter for screening for malignant neoplasm of cervix: Secondary | ICD-10-CM | POA: Insufficient documentation

## 2024-06-17 NOTE — Progress Notes (Signed)
 Subjective:        Jill Kerr is a 63 y.o. female here for a routine exam.  Current complaints: None.    Personal health questionnaire:  Is patient [name], have a family history of breast and/or ovarian cancer: no Is there a family history of uterine cancer diagnosed at age < 53, gastrointestinal cancer, urinary tract cancer, family member who is a Personnel officer syndrome-associated carrier: no Is the patient overweight and hypertensive, family history of diabetes, personal history of gestational diabetes, preeclampsia or PCOS: no Is patient over 75, have PCOS,  family history of premature CHD under age 70, diabetes, smoke, have hypertension or peripheral artery disease:  no At any time, has a partner hit, kicked or otherwise hurt or frightened you?: no Over the past 2 weeks, have you felt down, depressed or hopeless?: no Over the past 2 weeks, have you felt little interest or pleasure in doing things?:no   Gynecologic History No LMP recorded. Patient has had an ablation. Contraception: post menopausal status Last Pap: 11-26-2022. Results were: normal Last mammogram: unknown. Results were: normal  Obstetric History OB History  Gravida Para Term Preterm AB Living  5 3 3  2 3   SAB IAB Ectopic Multiple Live Births          # Outcome Date GA Lbr Len/2nd Weight Sex Type Anes PTL Lv  5 Term           4 Term           3 Term           2 AB           1 AB             Past Medical History:  Diagnosis Date   Osteoporosis 2023    Past Surgical History:  Procedure Laterality Date   BACK SURGERY     BACK SURGERY     BACK SURGERY     GASTRIC BYPASS     ROTATOR CUFF REPAIR Right    SPINAL FUSION       Current Outpatient Medications:    Abaloparatide (TYMLOS [place]), Inject into the skin., Disp: , Rfl:    cyanocobalamin (VITAMIN B12) 1000 MCG/ML injection, Inject 1,000 mcg into the muscle every 30 (thirty) days., Disp: , Rfl:    cyclobenzaprine (FLEXERIL) 10 MG tablet,  Take by mouth as needed. , Disp: , Rfl:    ergocalciferol (VITAMIN D2) 1.25 MG (50000 UT) capsule, Take 50,000 Units by mouth once a week. Takes every other week, Disp: , Rfl:    gabapentin (NEURONTIN) 400 MG capsule, as needed, Disp: , Rfl:    HYDROcodone -acetaminophen  (NORCO) 10-325 MG tablet, Take 1 tablet by mouth every 6 (six) hours as needed., Disp: 10 tablet, Rfl: 0   ketoconazole  (NIZORAL ) 2 % cream, Apply 1 Application topically 2 (two) times daily., Disp: 60 g, Rfl: 5   triamcinolone  cream (KENALOG ) 0.1 %, Apply 1 Application topically 2 (two) times daily. Apply topically 2 (two) times daily mix with Ketoconazole . Apply 2 times daily for 3 weeks then STOP for 3 weeks then resume for 3 weeks. Continue to alternate every 3 weeks., Disp: 453.6 g, Rfl: 4   Betamethasone  Diprop-Minoxidil  0.05-7 % SOLN, Apply once daily to scalp in the morning, Disp: 60 g, Rfl: 2   Efinaconazole  (JUBLIA ) 10 % SOLN, Use on affected nails daily. (Patient not taking: Reported on 06/17/2024), Disp: 8 mL, Rfl: 1   fluconazole  (DIFLUCAN ) 100 MG  tablet, Take 1 tablet (100 mg total) by mouth once a week. (Patient not taking: Reported on 06/17/2024), Disp: 6 tablet, Rfl: 0   itraconazole  (SPORANOX ) 100 MG capsule, Take 2 capsules (200 mg total) by mouth daily. (Patient not taking: Reported on 06/17/2024), Disp: 60 capsule, Rfl: 1   Safety Seal Miscellaneous MISC, Apply once daily to affected areas on scalp, Disp: 30 g, Rfl: 5 Allergies  Allergen Reactions   Penicillins    Tape     Adhesive     Social History   Tobacco Use   Smoking status: Never   Smokeless tobacco: Never  Substance Use Topics   Alcohol use: Yes    Comment: ocassionally    Family History  Problem Relation Age of Onset   Cancer Mother    Hypertension Mother       Review of Systems  Constitutional: negative for fatigue and weight loss Respiratory: negative for cough and wheezing Cardiovascular: negative for chest pain, fatigue and  palpitations Gastrointestinal: negative for abdominal pain and change in bowel habits Musculoskeletal:negative for myalgias Neurological: negative for gait problems and tremors Behavioral/Psych: negative for abusive relationship, depression Endocrine: negative for temperature intolerance    Genitourinary:negative for abnormal menstrual periods, genital lesions, hot flashes, sexual problems and vaginal discharge Integument/breast: negative for breast lump, breast tenderness, nipple discharge and skin lesion(s)    Objective:       BP 134/74   Pulse (!) 57   Ht 5' 5 (1.651 m)   Wt 201 lb 8 oz (91.4 kg)   BMI 33.53 kg/m  General:   alert  Skin:   no rash or abnormalities  Lungs:   clear to auscultation bilaterally  Heart:   regular rate and rhythm, S1, S2 normal, no murmur, click, rub or gallop  Breasts:   normal without suspicious masses, skin or nipple changes or axillary nodes  Abdomen:  normal findings: no organomegaly, soft, non-tender and no hernia  Pelvis:  External genitalia: normal general appearance Urinary system: urethral meatus normal and bladder without fullness, nontender Vaginal: normal without tenderness, induration or masses Cervix: normal appearance Adnexa: normal bimanual exam Uterus: anteverted and non-tender, normal size   Lab Review Urine pregnancy test Labs reviewed yes Radiologic studies reviewed yes  I have spent a total of 20 minutes of face-to-face time, excluding clinical staff time, reviewing notes and preparing to see patient, ordering tests and/or medications, and counseling the patient.   Assessment:    1. Encounter for gynecological examination with Papanicolaou smear of cervix (Primary) Rx: - Cytology - PAP( [facility])  2. Obesity (BMI 30.0-34.9) - weight reduction with the aid of dietary changes, exercise and behavioral modification recommended  3. H/O bariatric surgery     Plan:    Education reviewed: calcium supplements,  depression evaluation, low fat, low cholesterol diet, safe sex/STD prevention, self breast exams, and weight bearing exercise. Follow up in: 1 year.    CARLIN RONAL CENTERS, MD, FACOG Attending Obstetrician & Gynecologist, [facility] for [facility], [facility], [place] 06/17/2024

## 2024-06-17 NOTE — Progress Notes (Signed)
 Pt presents for annual. Pt has no questions or concerns at this time.

## 2024-06-21 LAB — CYTOLOGY - PAP
Comment: NEGATIVE
Diagnosis: NEGATIVE
High risk HPV: NEGATIVE

## 2024-06-22 ENCOUNTER — Ambulatory Visit: Payer: Self-pay | Admitting: Family Medicine

## 2024-10-26 ENCOUNTER — Ambulatory Visit: Admitting: Dermatology

## 2024-10-26 ENCOUNTER — Encounter: Payer: Self-pay | Admitting: Dermatology

## 2024-10-26 VITALS — BP 121/81 | HR 70

## 2024-10-26 DIAGNOSIS — L649 Androgenic alopecia, unspecified: Secondary | ICD-10-CM | POA: Diagnosis not present

## 2024-10-26 DIAGNOSIS — Z79899 Other long term (current) drug therapy: Secondary | ICD-10-CM | POA: Diagnosis not present

## 2024-10-26 DIAGNOSIS — L6681 Central centrifugal cicatricial alopecia: Secondary | ICD-10-CM

## 2024-10-26 DIAGNOSIS — B351 Tinea unguium: Secondary | ICD-10-CM

## 2024-10-26 MED ORDER — FLUOCINOLONE ACETONIDE SCALP 0.01 % EX OIL: 1.0000 | TOPICAL_OIL | Freq: Every day | CUTANEOUS | 9 refills | Status: AC | PRN

## 2024-10-26 MED ORDER — BETAMETHASONE DIPROP-MINOXIDIL 0.05-7 % EX SOLN: CUTANEOUS | 11 refills | Status: AC

## 2024-10-26 NOTE — Progress Notes (Signed)
 "  Follow-Up Visit   Subjective  Jill Kerr is a 64 y.o. female who presents for the following: Onychomycosis & Androgenetic Alopecia  Patient present today for follow up visit for Onychomycosis and Androgenetic Alopecia. Patient was last evaluated on 01/21/2024.   Onychomycosis: At this visit for onychomycosis, patient was prescribed Itraconazole  Capsules to take 2 times daily. She is now finished & reports sxs are better. Patient denies medication changes.  Androgenetic Alopecia & CCCA: At this visit for Androgenetic Alopecia & CCCA, patient was prescribed Minoxidil /Betamethasone  Solution to apply to her scalp. She reports she is currently washing with Native shampoo and conditioner about 1-2 times weekly. She applies the compound 2 times weekly.She states she is unable to apply daily beause she does exercise daily and she feels the sweat will run on her face. Patient reports sxs are improving but not at goal. She still has tenderness located on her scalp from time to time. Patient denies medication changes.  Patient provided verbal consent for the use of an AI-assisted program to generate a detailed after-visit summary. The patient understands that the AI tool is used to support clinical documentation and that all information will be reviewed and verified by the healthcare provider.  The following portions of the chart were reviewed this encounter and updated as appropriate: medications, allergies, medical history  Review of Systems:  No other skin or systemic complaints except as noted in HPI or Assessment and Plan.  Objective  Well appearing patient in no apparent distress; mood and affect are within normal limits.  A focused examination was performed of the following areas: Finger Nails and Scalp  Relevant exam findings are noted in the Assessment and Plan.                  Assessment & Plan   ONYCHOMYCOSIS Exam: Thickened toenails with subungal debris c/w  onychomycosis  Chronic and persistent condition with duration or expected duration over one year. Condition is symptomatic/ bothersome to patient. Not currently at goal.  Treatment Plan: - Continue to monitor, no additional treatment needed at this time  ANDROGENETIC ALOPECIA (FEMALE PATTERN HAIR LOSS) Exam: Diffuse thinning of the crown and widening of the midline part with retention of the frontal hairline  Improved but not at goal  Chronic condition with scalp tenderness and soreness, exacerbated by dryness and inconsistent medication use. Current treatment includes compounded minoxidil  and betamethasone  solution from [place]. Scalp pain is diffuse and occurs when medication is not used regularly or when the scalp is dry. Hair is thick, and there is concern about the shelf life of the compounded solution due to lack of preservatives. Discussed the use of collagen supplements for hair and nail health, with caution regarding calcium content due to osteoporosis and Tymlos use. - Continue compounded minoxidil  and betamethasone  solution from [place]. - Consider using Dermasmooth oil with mild steroid for scalp pain and as a vehicle for medication application. - Discussed collagen supplements for hair and nail health, ensuring calcium content is appropriate given osteoporosis and Tymlos use. - Advised on proper storage of compounded solution to extend shelf life. - Scheduled follow-up in October, with earlier appointment if issues arise.  Long term medication management.  Patient is using long term (months to years) prescription medication  to control their dermatologic condition.  These medications require periodic monitoring to evaluate for efficacy and side effects and may require periodic laboratory monitoring.   CENTRAL CENTRIFUGAL SCARRING ALOPECIA   Existing Treatments - Betamethasone  Diprop-Minoxidil   0.05-7 % SOLN - Apply once daily to scalp in the morning  Return in 9 months (on  07/26/2025) for Androgenetic Alopecia F/U.  I, Jetta Ager, am acting as neurosurgeon for [employer], DO.  Documentation: I have reviewed the above documentation for accuracy and completeness, and I agree with the above.  Delon Lenis, DO   "

## 2024-10-26 NOTE — Patient Instructions (Addendum)

## 2025-08-07 ENCOUNTER — Ambulatory Visit: Admitting: Dermatology
